# Patient Record
Sex: Female | Born: 1965 | Race: Black or African American | Hispanic: No | Marital: Single | Smoking: Never smoker
Health system: Southern US, Community
[De-identification: ages and names within clinical notes are randomized; demographics above are authoritative.]

## PROBLEM LIST (undated history)

## (undated) DIAGNOSIS — G43909 Migraine, unspecified, not intractable, without status migrainosus: Secondary | ICD-10-CM

## (undated) HISTORY — PX: NO PAST SURGERIES: SHX2092

---

## 2010-12-03 ENCOUNTER — Inpatient Hospital Stay (INDEPENDENT_AMBULATORY_CARE_PROVIDER_SITE_OTHER)
Admission: RE | Admit: 2010-12-03 | Discharge: 2010-12-03 | Disposition: A | Discharge status: Home / Self Care | Payer: PRIVATE HEALTH INSURANCE | Source: Ambulatory Visit | Attending: Family Medicine | Admitting: Family Medicine

## 2010-12-03 DIAGNOSIS — M549 Dorsalgia, unspecified: Secondary | ICD-10-CM

## 2010-12-03 DIAGNOSIS — M461 Sacroiliitis, not elsewhere classified: Secondary | ICD-10-CM

## 2011-01-29 ENCOUNTER — Inpatient Hospital Stay (INDEPENDENT_AMBULATORY_CARE_PROVIDER_SITE_OTHER)
Admission: RE | Admit: 2011-01-29 | Discharge: 2011-01-29 | Disposition: A | Discharge status: Home / Self Care | Payer: PRIVATE HEALTH INSURANCE | Source: Ambulatory Visit | Attending: Emergency Medicine | Admitting: Emergency Medicine

## 2011-01-29 DIAGNOSIS — L259 Unspecified contact dermatitis, unspecified cause: Secondary | ICD-10-CM

## 2011-12-17 ENCOUNTER — Other Ambulatory Visit: Payer: Self-pay | Admitting: Family Medicine

## 2011-12-17 ENCOUNTER — Ambulatory Visit
Admission: RE | Admit: 2011-12-17 | Discharge: 2011-12-17 | Disposition: A | Discharge status: Home / Self Care | Payer: BC Managed Care – PPO | Source: Ambulatory Visit | Attending: Family Medicine | Admitting: Family Medicine

## 2011-12-17 DIAGNOSIS — M439 Deforming dorsopathy, unspecified: Secondary | ICD-10-CM

## 2011-12-17 DIAGNOSIS — R52 Pain, unspecified: Secondary | ICD-10-CM

## 2012-01-02 ENCOUNTER — Other Ambulatory Visit: Payer: Self-pay | Admitting: Family Medicine

## 2012-01-02 DIAGNOSIS — M79605 Pain in left leg: Secondary | ICD-10-CM

## 2012-01-03 ENCOUNTER — Ambulatory Visit
Admission: RE | Admit: 2012-01-03 | Discharge: 2012-01-03 | Disposition: A | Discharge status: Home / Self Care | Payer: BC Managed Care – PPO | Source: Ambulatory Visit | Attending: Family Medicine | Admitting: Family Medicine

## 2012-01-03 DIAGNOSIS — M545 Low back pain: Secondary | ICD-10-CM

## 2012-02-28 ENCOUNTER — Emergency Department (HOSPITAL_COMMUNITY): Payer: BC Managed Care – PPO

## 2012-02-28 ENCOUNTER — Encounter (HOSPITAL_COMMUNITY): Payer: Self-pay | Admitting: Emergency Medicine

## 2012-02-28 ENCOUNTER — Emergency Department (HOSPITAL_COMMUNITY)
Admission: EM | Admit: 2012-02-28 | Discharge: 2012-02-28 | Disposition: A | Discharge status: Home / Self Care | Payer: BC Managed Care – PPO | Attending: Emergency Medicine | Admitting: Emergency Medicine

## 2012-02-28 DIAGNOSIS — D259 Leiomyoma of uterus, unspecified: Secondary | ICD-10-CM | POA: Diagnosis present

## 2012-02-28 DIAGNOSIS — R109 Unspecified abdominal pain: Secondary | ICD-10-CM | POA: Insufficient documentation

## 2012-02-28 DIAGNOSIS — Z79899 Other long term (current) drug therapy: Secondary | ICD-10-CM

## 2012-02-28 DIAGNOSIS — N719 Inflammatory disease of uterus, unspecified: Principal | ICD-10-CM | POA: Diagnosis present

## 2012-02-28 DIAGNOSIS — G8929 Other chronic pain: Secondary | ICD-10-CM | POA: Insufficient documentation

## 2012-02-28 DIAGNOSIS — M549 Dorsalgia, unspecified: Secondary | ICD-10-CM | POA: Diagnosis present

## 2012-02-28 DIAGNOSIS — R000 Tachycardia, unspecified: Secondary | ICD-10-CM | POA: Diagnosis present

## 2012-02-28 LAB — URINALYSIS, ROUTINE W REFLEX MICROSCOPIC
Bilirubin Urine: NEGATIVE
Ketones, ur: NEGATIVE mg/dL
Nitrite: NEGATIVE
Protein, ur: NEGATIVE mg/dL
pH: 6 (ref 5.0–8.0)

## 2012-02-28 LAB — CBC WITH DIFFERENTIAL/PLATELET
Basophils Absolute: 0 10*3/uL (ref 0.0–0.1)
Basophils Relative: 0 % (ref 0–1)
Eosinophils Absolute: 0 10*3/uL (ref 0.0–0.7)
Eosinophils Relative: 0 % (ref 0–5)
HCT: 37 % (ref 36.0–46.0)
Hemoglobin: 11.8 g/dL — ABNORMAL LOW (ref 12.0–15.0)
Lymphocytes Relative: 27 % (ref 12–46)
Lymphocytes Relative: 21 % (ref 12–46)
Lymphs Abs: 3.1 10*3/uL (ref 0.7–4.0)
MCH: 26.8 pg (ref 26.0–34.0)
MCHC: 33.8 g/dL (ref 30.0–36.0)
MCV: 81.7 fL (ref 78.0–100.0)
Monocytes Absolute: 1.3 10*3/uL — ABNORMAL HIGH (ref 0.1–1.0)
Monocytes Relative: 10 % (ref 3–12)
Neutro Abs: 7.4 10*3/uL (ref 1.7–7.7)
Neutrophils Relative %: 63 % (ref 43–77)
Platelets: 301 10*3/uL (ref 150–400)
Platelets: 307 10*3/uL (ref 150–400)
RBC: 4.41 MIL/uL (ref 3.87–5.11)
RDW: 13.9 % (ref 11.5–15.5)
WBC: 11.7 10*3/uL — ABNORMAL HIGH (ref 4.0–10.5)
WBC: 14.9 10*3/uL — ABNORMAL HIGH (ref 4.0–10.5)

## 2012-02-28 LAB — COMPREHENSIVE METABOLIC PANEL
ALT: 9 U/L (ref 0–35)
Alkaline Phosphatase: 74 U/L (ref 39–117)
BUN: 8 mg/dL (ref 6–23)
CO2: 27 mEq/L (ref 19–32)
Chloride: 100 mEq/L (ref 96–112)
GFR calc Af Amer: >90 mL/min (ref >90)
Glucose, Bld: 103 mg/dL — ABNORMAL HIGH (ref 70–99)
Potassium: 3.5 mEq/L (ref 3.5–5.1)
Sodium: 136 mEq/L (ref 135–145)
Total Bilirubin: 0.4 mg/dL (ref 0.3–1.2)

## 2012-02-28 LAB — URINE MICROSCOPIC-ADD ON

## 2012-02-28 LAB — WET PREP, GENITAL
Clue Cells Wet Prep HPF POC: NONE SEEN
Trich, Wet Prep: NONE SEEN

## 2012-02-28 MED ORDER — HYDROCODONE-ACETAMINOPHEN 5-325 MG PO TABS
2.0000 | ORAL_TABLET | Freq: Once | ORAL | Status: AC
  Administered 2012-02-28: 2 via ORAL
  Filled 2012-02-28: qty 2

## 2012-02-28 NOTE — ED Provider Notes (Signed)
History     CSN: [id number]  Arrival date & time 02/28/12  4098   First MD Initiated Contact with Patient 02/28/12 [phone]      Chief Complaint  Patient presents with  . Abdominal Pain  . Back Pain    (Consider location/radiation/quality/duration/timing/severity/associated sxs/prior treatment) HPI Comments: Patient presents with lower abdominal pain that started suddenly last night when she was laying down. She reports an intermittent, sharp and severe pain. She reports taking Lortab, which she has been prescribed for chronic back pain, which helped the pain. The pain does not radiate. She reports a history of chronic back pain, uterine fibroids. She denies abnormal vaginal discharge, abnormal bleeding, hematuria and dysuria. Her LMP was on 02/09/2012.   Patient is a 46 y.o. female presenting with abdominal pain and back pain.  Abdominal Pain The primary symptoms of the illness include abdominal pain. The primary symptoms of the illness do not include fever, fatigue, shortness of breath, nausea, vomiting, diarrhea, dysuria or vaginal discharge.  Additional symptoms associated with the illness include back pain. Symptoms associated with the illness do not include chills, diaphoresis or hematuria.  Back Pain  Associated symptoms include abdominal pain. Pertinent negatives include no chest pain, no fever, no numbness, no headaches and no dysuria.    Past Medical History  Diagnosis Date  . Chronic back pain     History reviewed. No pertinent past surgical history.  History reviewed. No pertinent family history.  History  Substance Use Topics  . Smoking status: Never Smoker   . Smokeless tobacco: Not on file  . Alcohol Use: No    OB History    Grav Para Term Preterm Abortions TAB SAB Ect Mult Living                  Review of Systems  Constitutional: Negative for fever, chills, diaphoresis and fatigue.  Eyes: Negative for visual disturbance.  Respiratory: Negative for  shortness of breath.   Cardiovascular: Negative for chest pain.  Gastrointestinal: Positive for abdominal pain. Negative for nausea, vomiting and diarrhea.  Genitourinary: Negative for dysuria, hematuria, vaginal discharge, difficulty urinating and menstrual problem.  Musculoskeletal: Positive for back pain.  Skin: Negative for rash and wound.  Neurological: Negative for dizziness, numbness and headaches.    Allergies  Penicillins  Home Medications   Current Outpatient Rx  Name Route Sig Dispense Refill  . CYCLOBENZAPRINE HCL ER 15 MG PO CP24 Oral Take 15 mg by mouth daily.    Marland Kitchen HYDROCODONE-ACETAMINOPHEN 7.5-500 MG PO TABS Oral Take 1 tablet by mouth every 8 (eight) hours as needed. For pain    . IBUPROFEN-FAMOTIDINE 800-26.6 MG PO TABS Oral Take 1 tablet by mouth 3 (three) times daily.    Marland Kitchen METHOCARBAMOL 500 MG PO TABS Oral Take 500 mg by mouth 3 (three) times daily.    . ADULT MULTIVITAMIN W/MINERALS CH Oral Take 1 tablet by mouth daily.      BP 144/63  Pulse 55  Temp 98.4 F (36.9 C) (Oral)  Resp 18  SpO2 97%  Physical Exam  Nursing note and vitals reviewed. Constitutional: She is oriented to person, place, and time. She appears well-developed and well-nourished. No distress.  HENT:  Head: Normocephalic and atraumatic.  Mouth/Throat: No oropharyngeal exudate.  Eyes: Conjunctivae are normal. No scleral icterus.  Neck: Normal range of motion.  Cardiovascular: Normal rate, regular rhythm and intact distal pulses.  Exam reveals no gallop and no friction rub.   No murmur heard. Pulmonary/Chest:  Effort normal and breath sounds normal. No respiratory distress. She has no wheezes. She has no rales. She exhibits no tenderness.  Abdominal: Soft.       Diffuse tenderness to palpation most notable in the lower abdomen, especially on the left side.   Genitourinary:       Speculum reveals profuse white, watery discharge in vagina. Bimanual exam reveals a large, tender uterus with  adnexal tenderness to palpation bilaterally. No masses palpated.   Musculoskeletal: Normal range of motion.  Neurological: She is alert and oriented to person, place, and time.  Skin: Skin is warm and dry. She is not diaphoretic.  Psychiatric: She has a normal mood and affect. Her behavior is normal.    ED Course  Procedures (including critical care time)  Labs Reviewed  CBC WITH DIFFERENTIAL - Abnormal; Notable for the following:    WBC 11.7 (*)     Hemoglobin 11.8 (*)     Monocytes Absolute 1.2 (*)     All other components within normal limits  COMPREHENSIVE METABOLIC PANEL - Abnormal; Notable for the following:    Glucose, Bld 103 (*)     Total Protein 8.4 (*)     All other components within normal limits  URINALYSIS, ROUTINE W REFLEX MICROSCOPIC - Abnormal; Notable for the following:    Leukocytes, UA TRACE (*)     All other components within normal limits  URINE MICROSCOPIC-ADD ON - Abnormal; Notable for the following:    Bacteria, UA FEW (*)     All other components within normal limits  WET PREP, GENITAL - Abnormal; Notable for the following:    WBC, Wet Prep HPF POC TOO NUMEROUS TO COUNT (*)     All other components within normal limits  POCT PREGNANCY, URINE  GC/CHLAMYDIA PROBE AMP, GENITAL   US Transvaginal Non-ob  02/28/2012  *RADIOLOGY REPORT*  Clinical Data:  Low abdominal pain.  Question ovarian torsion.  TRANSABDOMINAL ULTRASOUND OF PELVIS DOPPLER ULTRASOUND OF OVARIES  Technique:  Transabdominal ultrasound examination of the pelvis was performed including evaluation of the uterus, ovaries, adnexal regions, and pelvic cul-de-sac.  Color and duplex Doppler ultrasound was utilized to evaluate blood flow to the ovaries.  Comparison:  Lumbar MRI 01/03/2012.  Findings:  Uterus:  The uterus is heterogeneously enlarged with multiple fibroids.  Overall dimensions of the uterus are approximately 15.5 x 11.5 x 11.0 cm.  Largest fibroid is noted in the fundus, measuring  approximately 8.0 x 7.3 x 9.2 cm.  Endometrium:  Distorted by the fibroids and suboptimally visualized.  No gross endometrial thickening identified. Endometrial thickness is approximately 8 mm.  Right ovary:  Not well visualized but measuring approximately 3.2 x 1.8 x 1.7 cm.  No adnexal mass.  Blood flow is seen with color Doppler.  Left ovary:  Not visualized.  Pulsed Doppler evaluation demonstrates normal low-resistance arterial and venous waveforms in the right ovary.  The left ovary is not visualized.  IMPRESSION:  1.  Multiple uterine fibroids as partially imaged on the prior lumbar MRI. 2.  The right ovary appears normal and demonstrates normal blood flow.  The left ovary is not visualized.  No adnexal mass identified.   Original Report Authenticated By: Gerrianne Scale, M.D.    US Pelvis Complete  02/28/2012  *RADIOLOGY REPORT*  Clinical Data:  Low abdominal pain.  Question ovarian torsion.  TRANSABDOMINAL ULTRASOUND OF PELVIS DOPPLER ULTRASOUND OF OVARIES  Technique:  Transabdominal ultrasound examination of the pelvis was performed including evaluation of  the uterus, ovaries, adnexal regions, and pelvic cul-de-sac.  Color and duplex Doppler ultrasound was utilized to evaluate blood flow to the ovaries.  Comparison:  Lumbar MRI 01/03/2012.  Findings:  Uterus:  The uterus is heterogeneously enlarged with multiple fibroids.  Overall dimensions of the uterus are approximately 15.5 x 11.5 x 11.0 cm.  Largest fibroid is noted in the fundus, measuring approximately 8.0 x 7.3 x 9.2 cm.  Endometrium:  Distorted by the fibroids and suboptimally visualized.  No gross endometrial thickening identified. Endometrial thickness is approximately 8 mm.  Right ovary:  Not well visualized but measuring approximately 3.2 x 1.8 x 1.7 cm.  No adnexal mass.  Blood flow is seen with color Doppler.  Left ovary:  Not visualized.  Pulsed Doppler evaluation demonstrates normal low-resistance arterial and venous waveforms in the  right ovary.  The left ovary is not visualized.  IMPRESSION:  1.  Multiple uterine fibroids as partially imaged on the prior lumbar MRI. 2.  The right ovary appears normal and demonstrates normal blood flow.  The left ovary is not visualized.  No adnexal mass identified.   Original Report Authenticated By: Gerrianne Scale, M.D.    [place] Art/ven Flow Abd Pelv Doppler  02/28/2012  *RADIOLOGY REPORT*  Clinical Data:  Low abdominal pain.  Question ovarian torsion.  TRANSABDOMINAL ULTRASOUND OF PELVIS DOPPLER ULTRASOUND OF OVARIES  Technique:  Transabdominal ultrasound examination of the pelvis was performed including evaluation of the uterus, ovaries, adnexal regions, and pelvic cul-de-sac.  Color and duplex Doppler ultrasound was utilized to evaluate blood flow to the ovaries.  Comparison:  Lumbar MRI 01/03/2012.  Findings:  Uterus:  The uterus is heterogeneously enlarged with multiple fibroids.  Overall dimensions of the uterus are approximately 15.5 x 11.5 x 11.0 cm.  Largest fibroid is noted in the fundus, measuring approximately 8.0 x 7.3 x 9.2 cm.  Endometrium:  Distorted by the fibroids and suboptimally visualized.  No gross endometrial thickening identified. Endometrial thickness is approximately 8 mm.  Right ovary:  Not well visualized but measuring approximately 3.2 x 1.8 x 1.7 cm.  No adnexal mass.  Blood flow is seen with color Doppler.  Left ovary:  Not visualized.  Pulsed Doppler evaluation demonstrates normal low-resistance arterial and venous waveforms in the right ovary.  The left ovary is not visualized.  IMPRESSION:  1.  Multiple uterine fibroids as partially imaged on the prior lumbar MRI. 2.  The right ovary appears normal and demonstrates normal blood flow.  The left ovary is not visualized.  No adnexal mass identified.   Original Report Authenticated By: Gerrianne Scale, M.D.      No diagnosis found.    MDM  1:00 PM Patient reports lower abdominal pain. I am controlling her pain with  Vicodin currently. Her pelvic exam reveals lower abdominal tenderness on bimanual exam and profuse watery discharge on speculum exam. She will be transferred to CDU with orders for a pelvic ultrasound with doppler flow. If no ovarian torsion or new mass, she can be discharged with follow up with OBGYN. She should also be treated according to her wet prep and GC/Chlamydia results. Patient signed out to Levy Sjogren ACNP.       Emilia Beck, PA-C 02/28/12 1312

## 2012-02-28 NOTE — ED Provider Notes (Signed)
Medical screening examination/treatment/procedure(s) were conducted as a shared visit with non-physician practitioner(s) and myself.  I personally evaluated the patient during the encounter  Doug Sou, MD 02/28/12 1718

## 2012-02-28 NOTE — ED Provider Notes (Addendum)
Complains of left lower quadrant pain gradual onset last night. Treat with ibuprofen 800 mg without relief. No nausea no vomiting no fever last bowel movement 2 days ago, normal no anorexia no other complaint the pain is mild at present as I examine her on exam abdomen normoactive bowel sounds nondistended minimally tender at left lower quadrant  Doug Sou, MD 02/28/12 1053  1 p.m. patient resting comfortably. His radial home. Plan home observation clear liquids for 12 hours Return if condition worsens Diagnosis nonspecific abdominal pain. Results for orders placed during the hospital encounter of 02/28/12  CBC WITH DIFFERENTIAL      Component Value Range   WBC 11.7 (*) 4.0 - 10.5 K/uL   RBC 4.41  3.87 - 5.11 MIL/uL   Hemoglobin 11.8 (*) 12.0 - 15.0 g/dL   HCT 29.5  62.1 - 30.8 %   MCV 81.6  78.0 - 100.0 fL   MCH 26.8  26.0 - 34.0 pg   MCHC 32.8  30.0 - 36.0 g/dL   RDW 65.7  84.6 - 96.2 %   Platelets 301  150 - 400 K/uL   Neutrophils Relative 63  43 - 77 %   Neutro Abs 7.4  1.7 - 7.7 K/uL   Lymphocytes Relative 27  12 - 46 %   Lymphs Abs 3.1  0.7 - 4.0 K/uL   Monocytes Relative 10  3 - 12 %   Monocytes Absolute 1.2 (*) 0.1 - 1.0 K/uL   Eosinophils Relative 0  0 - 5 %   Eosinophils Absolute 0.0  0.0 - 0.7 K/uL   Basophils Relative 0  0 - 1 %   Basophils Absolute 0.0  0.0 - 0.1 K/uL  COMPREHENSIVE METABOLIC PANEL      Component Value Range   Sodium 136  135 - 145 mEq/L   Potassium 3.5  3.5 - 5.1 mEq/L   Chloride 100  96 - 112 mEq/L   CO2 27  19 - 32 mEq/L   Glucose, Bld 103 (*) 70 - 99 mg/dL   BUN 8  6 - 23 mg/dL   Creatinine, Ser 9.52  0.50 - 1.10 mg/dL   Calcium 9.7  8.4 - 84.1 mg/dL   Total Protein 8.4 (*) 6.0 - 8.3 g/dL   Albumin 3.8  3.5 - 5.2 g/dL   AST 11  0 - 37 U/L   ALT 9  0 - 35 U/L   Alkaline Phosphatase 74  39 - 117 U/L   Total Bilirubin 0.4  0.3 - 1.2 mg/dL   GFR calc non Af Amer >90  >90 mL/min   GFR calc Af Amer >90  >90 mL/min  URINALYSIS, ROUTINE  W REFLEX MICROSCOPIC      Component Value Range   Color, Urine YELLOW  YELLOW   APPearance CLEAR  CLEAR   Specific Gravity, Urine 1.006  1.005 - 1.030   pH 6.0  5.0 - 8.0   Glucose, UA NEGATIVE  NEGATIVE mg/dL   Hgb urine dipstick NEGATIVE  NEGATIVE   Bilirubin Urine NEGATIVE  NEGATIVE   Ketones, ur NEGATIVE  NEGATIVE mg/dL   Protein, ur NEGATIVE  NEGATIVE mg/dL   Urobilinogen, UA 0.2  0.0 - 1.0 mg/dL   Nitrite NEGATIVE  NEGATIVE   Leukocytes, UA TRACE (*) NEGATIVE  POCT PREGNANCY, URINE      Component Value Range   Preg Test, Ur NEGATIVE  NEGATIVE  URINE MICROSCOPIC-ADD ON      Component Value Range   Squamous Epithelial /  LPF RARE  RARE   WBC, UA 0-2  <3 WBC/hpf   Bacteria, UA FEW (*) RARE  WET PREP, GENITAL      Component Value Range   Yeast Wet Prep HPF POC NONE SEEN  NONE SEEN   Trich, Wet Prep NONE SEEN  NONE SEEN   Clue Cells Wet Prep HPF POC NONE SEEN  NONE SEEN   WBC, Wet Prep HPF POC TOO NUMEROUS TO COUNT (*) NONE SEEN   US Transvaginal Non-ob  02/28/2012  *RADIOLOGY REPORT*  Clinical Data:  Low abdominal pain.  Question ovarian torsion.  TRANSABDOMINAL ULTRASOUND OF PELVIS DOPPLER ULTRASOUND OF OVARIES  Technique:  Transabdominal ultrasound examination of the pelvis was performed including evaluation of the uterus, ovaries, adnexal regions, and pelvic cul-de-sac.  Color and duplex Doppler ultrasound was utilized to evaluate blood flow to the ovaries.  Comparison:  Lumbar MRI 01/03/2012.  Findings:  Uterus:  The uterus is heterogeneously enlarged with multiple fibroids.  Overall dimensions of the uterus are approximately 15.5 x 11.5 x 11.0 cm.  Largest fibroid is noted in the fundus, measuring approximately 8.0 x 7.3 x 9.2 cm.  Endometrium:  Distorted by the fibroids and suboptimally visualized.  No gross endometrial thickening identified. Endometrial thickness is approximately 8 mm.  Right ovary:  Not well visualized but measuring approximately 3.2 x 1.8 x 1.7 cm.  No adnexal  mass.  Blood flow is seen with color Doppler.  Left ovary:  Not visualized.  Pulsed Doppler evaluation demonstrates normal low-resistance arterial and venous waveforms in the right ovary.  The left ovary is not visualized.  IMPRESSION:  1.  Multiple uterine fibroids as partially imaged on the prior lumbar MRI. 2.  The right ovary appears normal and demonstrates normal blood flow.  The left ovary is not visualized.  No adnexal mass identified.   Original Report Authenticated By: Gerrianne Scale, M.D.    US Pelvis Complete  02/28/2012  *RADIOLOGY REPORT*  Clinical Data:  Low abdominal pain.  Question ovarian torsion.  TRANSABDOMINAL ULTRASOUND OF PELVIS DOPPLER ULTRASOUND OF OVARIES  Technique:  Transabdominal ultrasound examination of the pelvis was performed including evaluation of the uterus, ovaries, adnexal regions, and pelvic cul-de-sac.  Color and duplex Doppler ultrasound was utilized to evaluate blood flow to the ovaries.  Comparison:  Lumbar MRI 01/03/2012.  Findings:  Uterus:  The uterus is heterogeneously enlarged with multiple fibroids.  Overall dimensions of the uterus are approximately 15.5 x 11.5 x 11.0 cm.  Largest fibroid is noted in the fundus, measuring approximately 8.0 x 7.3 x 9.2 cm.  Endometrium:  Distorted by the fibroids and suboptimally visualized.  No gross endometrial thickening identified. Endometrial thickness is approximately 8 mm.  Right ovary:  Not well visualized but measuring approximately 3.2 x 1.8 x 1.7 cm.  No adnexal mass.  Blood flow is seen with color Doppler.  Left ovary:  Not visualized.  Pulsed Doppler evaluation demonstrates normal low-resistance arterial and venous waveforms in the right ovary.  The left ovary is not visualized.  IMPRESSION:  1.  Multiple uterine fibroids as partially imaged on the prior lumbar MRI. 2.  The right ovary appears normal and demonstrates normal blood flow.  The left ovary is not visualized.  No adnexal mass identified.   Original Report  Authenticated By: Gerrianne Scale, M.D.    [place] Art/ven Flow Abd Pelv Doppler  02/28/2012  *RADIOLOGY REPORT*  Clinical Data:  Low abdominal pain.  Question ovarian torsion.  TRANSABDOMINAL ULTRASOUND OF  PELVIS DOPPLER ULTRASOUND OF OVARIES  Technique:  Transabdominal ultrasound examination of the pelvis was performed including evaluation of the uterus, ovaries, adnexal regions, and pelvic cul-de-sac.  Color and duplex Doppler ultrasound was utilized to evaluate blood flow to the ovaries.  Comparison:  Lumbar MRI 01/03/2012.  Findings:  Uterus:  The uterus is heterogeneously enlarged with multiple fibroids.  Overall dimensions of the uterus are approximately 15.5 x 11.5 x 11.0 cm.  Largest fibroid is noted in the fundus, measuring approximately 8.0 x 7.3 x 9.2 cm.  Endometrium:  Distorted by the fibroids and suboptimally visualized.  No gross endometrial thickening identified. Endometrial thickness is approximately 8 mm.  Right ovary:  Not well visualized but measuring approximately 3.2 x 1.8 x 1.7 cm.  No adnexal mass.  Blood flow is seen with color Doppler.  Left ovary:  Not visualized.  Pulsed Doppler evaluation demonstrates normal low-resistance arterial and venous waveforms in the right ovary.  The left ovary is not visualized.  IMPRESSION:  1.  Multiple uterine fibroids as partially imaged on the prior lumbar MRI. 2.  The right ovary appears normal and demonstrates normal blood flow.  The left ovary is not visualized.  No adnexal mass identified.   Original Report Authenticated By: Gerrianne Scale, M.D.      Doug Sou, MD 02/28/12 [phone]

## 2012-02-28 NOTE — ED Notes (Signed)
Pt c/o lower abd pain into back on left side x 2 days

## 2012-02-28 NOTE — ED Notes (Signed)
Pt reports having lower abd pain since yesterday, reports urinary frequency, denies dysuria, reports vaginal pain; denies CP; came to hospital earlier for same --had pelvic exam done, labs, u/s, and urine sent; was d/c'ed home; was told she had fibroid tumor

## 2012-02-29 ENCOUNTER — Emergency Department (HOSPITAL_COMMUNITY): Payer: BC Managed Care – PPO

## 2012-02-29 ENCOUNTER — Encounter (HOSPITAL_COMMUNITY): Payer: Self-pay | Admitting: *Deleted

## 2012-02-29 ENCOUNTER — Inpatient Hospital Stay (HOSPITAL_COMMUNITY)
Admission: EM | Admit: 2012-02-29 | Discharge: 2012-03-03 | DRG: 368 | Disposition: A | Discharge status: Home / Self Care | Payer: BC Managed Care – PPO | Attending: Obstetrics & Gynecology | Admitting: Obstetrics & Gynecology

## 2012-02-29 DIAGNOSIS — N739 Female pelvic inflammatory disease, unspecified: Secondary | ICD-10-CM

## 2012-02-29 DIAGNOSIS — D219 Benign neoplasm of connective and other soft tissue, unspecified: Secondary | ICD-10-CM

## 2012-02-29 DIAGNOSIS — R109 Unspecified abdominal pain: Secondary | ICD-10-CM

## 2012-02-29 HISTORY — DX: Migraine, unspecified, not intractable, without status migrainosus: G43.909

## 2012-02-29 LAB — COMPREHENSIVE METABOLIC PANEL
ALT: 10 U/L (ref 0–35)
AST: 13 U/L (ref 0–37)
Albumin: 3.9 g/dL (ref 3.5–5.2)
Calcium: 10.1 mg/dL (ref 8.4–10.5)
Creatinine, Ser: 0.63 mg/dL (ref 0.50–1.10)
GFR calc non Af Amer: >90 mL/min (ref >90)
Sodium: 134 mEq/L — ABNORMAL LOW (ref 135–145)
Total Protein: 9.1 g/dL — ABNORMAL HIGH (ref 6.0–8.3)

## 2012-02-29 LAB — LIPASE, BLOOD: Lipase: 19 U/L (ref 11–59)

## 2012-02-29 LAB — URINALYSIS, ROUTINE W REFLEX MICROSCOPIC
Bilirubin Urine: NEGATIVE
Glucose, UA: NEGATIVE mg/dL
Hgb urine dipstick: NEGATIVE
Specific Gravity, Urine: 1.006 (ref 1.005–1.030)
Urobilinogen, UA: 0.2 mg/dL (ref 0.0–1.0)
pH: 7.5 (ref 5.0–8.0)

## 2012-02-29 LAB — POCT PREGNANCY, URINE: Preg Test, Ur: NEGATIVE

## 2012-02-29 LAB — GC/CHLAMYDIA PROBE AMP, GENITAL: Chlamydia, DNA Probe: NEGATIVE

## 2012-02-29 MED ORDER — MORPHINE SULFATE 4 MG/ML IJ SOLN
4.0000 mg | Freq: Once | INTRAMUSCULAR | Status: AC
  Administered 2012-02-29: 4 mg via INTRAVENOUS
  Filled 2012-02-29: qty 1

## 2012-02-29 MED ORDER — IOHEXOL 300 MG/ML  SOLN
100.0000 mL | Freq: Once | INTRAMUSCULAR | Status: AC | PRN
  Administered 2012-02-29: 100 mL via INTRAVENOUS

## 2012-02-29 MED ORDER — CEFTRIAXONE SODIUM 250 MG IJ SOLR: 250.0000 mg | Freq: Once | INTRAMUSCULAR | Status: DC

## 2012-02-29 MED ORDER — IOHEXOL 300 MG/ML  SOLN
20.0000 mL | INTRAMUSCULAR | Status: AC
  Administered 2012-02-29: 20 mL via ORAL

## 2012-02-29 MED ORDER — ONDANSETRON HCL 4 MG/2ML IJ SOLN
4.0000 mg | Freq: Once | INTRAMUSCULAR | Status: AC
  Administered 2012-02-29: 4 mg via INTRAVENOUS
  Filled 2012-02-29: qty 2

## 2012-02-29 MED ORDER — HYDROCODONE-ACETAMINOPHEN 5-325 MG PO TABS
1.0000 | ORAL_TABLET | ORAL | Status: DC | PRN
  Administered 2012-02-29 – 2012-03-02 (×8): 2 via ORAL
  Filled 2012-02-29 (×8): qty 2

## 2012-02-29 MED ORDER — METRONIDAZOLE 500 MG PO TABS
1500.0000 mg | ORAL_TABLET | Freq: Once | ORAL | Status: AC
  Administered 2012-02-29: 1500 mg via ORAL
  Filled 2012-02-29 (×2): qty 3

## 2012-02-29 MED ORDER — SODIUM CHLORIDE 0.9 % IV SOLN: INTRAVENOUS | Status: DC

## 2012-02-29 MED ORDER — DEXTROSE 5 % IV SOLN
2.0000 g | Freq: Four times a day (QID) | INTRAVENOUS | Status: DC
  Administered 2012-02-29 – 2012-03-03 (×12): 2 g via INTRAVENOUS
  Filled 2012-02-29 (×15): qty 2

## 2012-02-29 MED ORDER — PRENATAL MULTIVITAMIN CH
1.0000 | ORAL_TABLET | Freq: Every day | ORAL | Status: DC
  Administered 2012-02-29 – 2012-03-03 (×4): 1 via ORAL
  Filled 2012-02-29 (×4): qty 1

## 2012-02-29 MED ORDER — DEXTROSE 5 % IV SOLN
1.0000 g | Freq: Once | INTRAVENOUS | Status: AC
  Administered 2012-02-29: 1 g via INTRAVENOUS
  Filled 2012-02-29: qty 10

## 2012-02-29 MED ORDER — KCL IN DEXTROSE-NACL 10-5-0.45 MEQ/L-%-% IV SOLN
INTRAVENOUS | Status: DC
  Administered 2012-02-29 – 2012-03-01 (×3): via INTRAVENOUS
  Filled 2012-02-29 (×4): qty 1000

## 2012-02-29 MED ORDER — IBUPROFEN 600 MG PO TABS
600.0000 mg | ORAL_TABLET | Freq: Four times a day (QID) | ORAL | Status: DC | PRN
  Administered 2012-03-01 – 2012-03-02 (×4): 600 mg via ORAL
  Filled 2012-02-29 (×3): qty 1

## 2012-02-29 MED ORDER — HYDROMORPHONE HCL PF 1 MG/ML IJ SOLN
1.0000 mg | INTRAMUSCULAR | Status: AC | PRN
  Administered 2012-02-29 (×2): 1 mg via INTRAVENOUS
  Filled 2012-02-29 (×2): qty 1

## 2012-02-29 MED ORDER — SODIUM CHLORIDE 0.9 % IV BOLUS (SEPSIS)
1000.0000 mL | Freq: Once | INTRAVENOUS | Status: AC
  Administered 2012-02-29: 1000 mL via INTRAVENOUS

## 2012-02-29 MED ORDER — DOXYCYCLINE HYCLATE 100 MG PO TABS
100.0000 mg | ORAL_TABLET | Freq: Two times a day (BID) | ORAL | Status: DC
  Administered 2012-02-29 – 2012-03-03 (×6): 100 mg via ORAL
  Filled 2012-02-29 (×7): qty 1

## 2012-02-29 MED ORDER — DEXTROSE 5 % IV SOLN
500.0000 mg | Freq: Once | INTRAVENOUS | Status: AC
  Administered 2012-02-29: 500 mg via INTRAVENOUS
  Filled 2012-02-29: qty 500

## 2012-02-29 MED ORDER — METRONIDAZOLE IVPB CUSTOM: 1.0000 g | Freq: Once | INTRAVENOUS | Status: DC

## 2012-02-29 MED ORDER — DOXYCYCLINE HYCLATE 100 MG IV SOLR
100.0000 mg | Freq: Two times a day (BID) | INTRAVENOUS | Status: DC
  Filled 2012-02-29 (×2): qty 100

## 2012-02-29 MED ORDER — METRONIDAZOLE IN NACL 5-0.79 MG/ML-% IV SOLN
500.0000 mg | Freq: Once | INTRAVENOUS | Status: AC
  Administered 2012-02-29: 500 mg via INTRAVENOUS
  Filled 2012-02-29: qty 100

## 2012-02-29 MED ORDER — ONDANSETRON HCL 4 MG/2ML IJ SOLN
4.0000 mg | Freq: Three times a day (TID) | INTRAMUSCULAR | Status: AC | PRN
  Administered 2012-02-29: 4 mg via INTRAVENOUS
  Filled 2012-02-29: qty 2

## 2012-02-29 MED ORDER — ACETAMINOPHEN 325 MG PO TABS
975.0000 mg | ORAL_TABLET | Freq: Once | ORAL | Status: AC
  Administered 2012-02-29: 975 mg via ORAL
  Filled 2012-02-29: qty 3

## 2012-02-29 NOTE — ED Notes (Signed)
CT called at notified them that the patient has completed the contrast

## 2012-02-29 NOTE — ED Notes (Signed)
Assisted up to the BR.  Amb very slowly

## 2012-02-29 NOTE — ED Notes (Signed)
Pt. Very drowsy after receiving pain medication.    Will monitor. Pain has decreased 6/10.  Vitals WNL, pt is in NAD

## 2012-02-29 NOTE — ED Provider Notes (Addendum)
History     CSN: [id number]  Arrival date & time 02/28/12  2253   First MD Initiated Contact with Patient 02/29/12 0147      Chief Complaint  Patient presents with  . Abdominal Pain    (Consider location/radiation/quality/duration/timing/severity/associated sxs/prior treatment) HPI Pt seen yesterday for lower abd pain starting 2 days ago. U/s showed fibroids. Pt states the pain has worsened since leaving ED. She LLQ>RLQ pain. She had profuse vag D/C on pelvic exam. + too numerous to count WBC on wet prep with uterine and adnexal TTP.  Past Medical History  Diagnosis Date  . Chronic back pain   . Migraine     History reviewed. No pertinent past surgical history.  No family history on file.  History  Substance Use Topics  . Smoking status: Never Smoker   . Smokeless tobacco: Not on file  . Alcohol Use: No    OB History    Grav Para Term Preterm Abortions TAB SAB Ect Mult Living                  Review of Systems  Constitutional: Negative for fever and chills.  Gastrointestinal: Positive for abdominal pain. Negative for nausea, vomiting and diarrhea.  Genitourinary: Positive for vaginal discharge and pelvic pain. Negative for flank pain, vaginal bleeding and vaginal pain.  Skin: Negative for rash and wound.  Neurological: Negative for dizziness, weakness, light-headedness, numbness and headaches.    Allergies  Oatmeal; Shrimp; Coconut oil; and Penicillins  Home Medications   Current Outpatient Rx  Name Route Sig Dispense Refill  . CYCLOBENZAPRINE HCL ER 15 MG PO CP24 Oral Take 15 mg by mouth daily.    . IBUPROFEN-FAMOTIDINE 800-26.6 MG PO TABS Oral Take 1 tablet by mouth 3 (three) times daily.    Marland Kitchen METHOCARBAMOL 500 MG PO TABS Oral Take 500 mg by mouth 3 (three) times daily.    . ADULT MULTIVITAMIN W/MINERALS CH Oral Take 1 tablet by mouth daily.      BP 105/70  Pulse 113  Temp 98.4 F (36.9 C) (Oral)  Resp 20  SpO2 96%  LMP 02/09/2012  Physical Exam    Nursing note and vitals reviewed. Constitutional: She is oriented to person, place, and time. She appears well-developed and well-nourished. She appears distressed.  HENT:  Head: Normocephalic and atraumatic.  Mouth/Throat: Oropharynx is clear and moist.  Eyes: EOM are normal. Pupils are equal, round, and reactive to light.  Neck: Normal range of motion. Neck supple.  Cardiovascular: Regular rhythm.        tachycardia  Pulmonary/Chest: Effort normal and breath sounds normal. No respiratory distress. She has no wheezes. She has no rales.  Abdominal: Soft. Bowel sounds are normal. She exhibits no distension and no mass. There is tenderness (TTP LLQ >RLQ. no rebound or guarding). There is no rebound and no guarding.  Genitourinary:       Refer to recent pelvic exam by PA.   Musculoskeletal: Normal range of motion. She exhibits no edema and no tenderness.  Neurological: She is alert and oriented to person, place, and time.  Skin: Skin is warm and dry. No rash noted. No erythema.  Psychiatric: She has a normal mood and affect. Her behavior is normal.    ED Course  Procedures (including critical care time)  Labs Reviewed  CBC WITH DIFFERENTIAL - Abnormal; Notable for the following:    WBC 14.9 (*)     Neutro Abs 10.5 (*)     Monocytes  Absolute 1.3 (*)     All other components within normal limits  COMPREHENSIVE METABOLIC PANEL - Abnormal; Notable for the following:    Sodium 134 (*)     Glucose, Bld 117 (*)     Total Protein 9.1 (*)     All other components within normal limits  URINALYSIS, ROUTINE W REFLEX MICROSCOPIC - Abnormal; Notable for the following:    APPearance CLOUDY (*)     Ketones, ur 15 (*)     All other components within normal limits  POCT PREGNANCY, URINE  LIPASE, BLOOD   US Transvaginal Non-ob  02/28/2012  *RADIOLOGY REPORT*  Clinical Data:  Low abdominal pain.  Question ovarian torsion.  TRANSABDOMINAL ULTRASOUND OF PELVIS DOPPLER ULTRASOUND OF OVARIES   Technique:  Transabdominal ultrasound examination of the pelvis was performed including evaluation of the uterus, ovaries, adnexal regions, and pelvic cul-de-sac.  Color and duplex Doppler ultrasound was utilized to evaluate blood flow to the ovaries.  Comparison:  Lumbar MRI 01/03/2012.  Findings:  Uterus:  The uterus is heterogeneously enlarged with multiple fibroids.  Overall dimensions of the uterus are approximately 15.5 x 11.5 x 11.0 cm.  Largest fibroid is noted in the fundus, measuring approximately 8.0 x 7.3 x 9.2 cm.  Endometrium:  Distorted by the fibroids and suboptimally visualized.  No gross endometrial thickening identified. Endometrial thickness is approximately 8 mm.  Right ovary:  Not well visualized but measuring approximately 3.2 x 1.8 x 1.7 cm.  No adnexal mass.  Blood flow is seen with color Doppler.  Left ovary:  Not visualized.  Pulsed Doppler evaluation demonstrates normal low-resistance arterial and venous waveforms in the right ovary.  The left ovary is not visualized.  IMPRESSION:  1.  Multiple uterine fibroids as partially imaged on the prior lumbar MRI. 2.  The right ovary appears normal and demonstrates normal blood flow.  The left ovary is not visualized.  No adnexal mass identified.   Original Report Authenticated By: Gerrianne Scale, M.D.    US Pelvis Complete  02/28/2012  *RADIOLOGY REPORT*  Clinical Data:  Low abdominal pain.  Question ovarian torsion.  TRANSABDOMINAL ULTRASOUND OF PELVIS DOPPLER ULTRASOUND OF OVARIES  Technique:  Transabdominal ultrasound examination of the pelvis was performed including evaluation of the uterus, ovaries, adnexal regions, and pelvic cul-de-sac.  Color and duplex Doppler ultrasound was utilized to evaluate blood flow to the ovaries.  Comparison:  Lumbar MRI 01/03/2012.  Findings:  Uterus:  The uterus is heterogeneously enlarged with multiple fibroids.  Overall dimensions of the uterus are approximately 15.5 x 11.5 x 11.0 cm.  Largest fibroid  is noted in the fundus, measuring approximately 8.0 x 7.3 x 9.2 cm.  Endometrium:  Distorted by the fibroids and suboptimally visualized.  No gross endometrial thickening identified. Endometrial thickness is approximately 8 mm.  Right ovary:  Not well visualized but measuring approximately 3.2 x 1.8 x 1.7 cm.  No adnexal mass.  Blood flow is seen with color Doppler.  Left ovary:  Not visualized.  Pulsed Doppler evaluation demonstrates normal low-resistance arterial and venous waveforms in the right ovary.  The left ovary is not visualized.  IMPRESSION:  1.  Multiple uterine fibroids as partially imaged on the prior lumbar MRI. 2.  The right ovary appears normal and demonstrates normal blood flow.  The left ovary is not visualized.  No adnexal mass identified.   Original Report Authenticated By: Gerrianne Scale, M.D.    Ct Abdomen Pelvis W Contrast  02/29/2012  *  RADIOLOGY REPORT*  Clinical Data: Abdominal pain  CT ABDOMEN AND PELVIS WITH CONTRAST  Technique:  Multidetector CT imaging of the abdomen and pelvis was performed following the standard protocol during bolus administration of intravenous contrast.  Contrast: OMNIPAQUE IOHEXOL 300 MG/ML  SOLN  Comparison: 02/28/2012 ultrasound  Findings: Mild bibasilar opacities.  Heart size upper normal limits.  Subcentimeter hypodensity within the right hepatic lobe is most in keeping with a biliary cyst or hamartoma.  Distended gallbladder, contains gallstones.  No pericholecystic fluid or gallbladder wall thickening.  No biliary ductal dilatation.  Unremarkable spleen, pancreas, and adrenal glands.  Subcentimeter hypodensity within the lower pole right kidney is nonspecific.  Right pelvicaliectasis. Unable to follow the ureteral course in its entirety as the ureter is decompressed and displaced by the enlarged/fibroid uterus. No hydroureter where visualized.  No bowel obstruction.  No CT evidence for colitis.  Normal appendix.  No free intraperitoneal air or  fluid.  No lymphadenopathy.  Normal caliber vasculature.  Thin-walled bladder.  Fibroid uterus.  Displaced adnexa without definite mass.  Trace free fluid within the pelvis.  No acute osseous finding.  IMPRESSION: Enlarged/fibroid uterus.  Right-sided pelvicaliectasis without hydroureter.  Cholelithiasis without CT evidence for cholecystitis.   Original Report Authenticated By: Waneta Martins, M.D.    [place] Art/ven Flow Abd Pelv Doppler  02/28/2012  *RADIOLOGY REPORT*  Clinical Data:  Low abdominal pain.  Question ovarian torsion.  TRANSABDOMINAL ULTRASOUND OF PELVIS DOPPLER ULTRASOUND OF OVARIES  Technique:  Transabdominal ultrasound examination of the pelvis was performed including evaluation of the uterus, ovaries, adnexal regions, and pelvic cul-de-sac.  Color and duplex Doppler ultrasound was utilized to evaluate blood flow to the ovaries.  Comparison:  Lumbar MRI 01/03/2012.  Findings:  Uterus:  The uterus is heterogeneously enlarged with multiple fibroids.  Overall dimensions of the uterus are approximately 15.5 x 11.5 x 11.0 cm.  Largest fibroid is noted in the fundus, measuring approximately 8.0 x 7.3 x 9.2 cm.  Endometrium:  Distorted by the fibroids and suboptimally visualized.  No gross endometrial thickening identified. Endometrial thickness is approximately 8 mm.  Right ovary:  Not well visualized but measuring approximately 3.2 x 1.8 x 1.7 cm.  No adnexal mass.  Blood flow is seen with color Doppler.  Left ovary:  Not visualized.  Pulsed Doppler evaluation demonstrates normal low-resistance arterial and venous waveforms in the right ovary.  The left ovary is not visualized.  IMPRESSION:  1.  Multiple uterine fibroids as partially imaged on the prior lumbar MRI. 2.  The right ovary appears normal and demonstrates normal blood flow.  The left ovary is not visualized.  No adnexal mass identified.   Original Report Authenticated By: Gerrianne Scale, M.D.      1. Pelvic inflammatory disease (PID)    2. Abdominal pain   3. Fibroids       MDM   CT with enlarged uterus with multiple fibroids. GB is non-tender to palp. Based on pelvic exam done earlier with profuse d/c and TNTC WBC, will treat for PID.    Discussed with Dr Christell Constant. Pt has persistent tachycardia and pain has returned. Will treat with continued IVF's and IV abx. Dr will admit to observation bed and see.         Loren Racer, MD 02/29/12 0800  Loren Racer, MD 02/29/12 [phone]

## 2012-02-29 NOTE — ED Notes (Signed)
Requesting something to eat or drink until all results are back

## 2012-02-29 NOTE — ED Notes (Signed)
Patient rates pain 10/10 at this time.  Med ordered

## 2012-02-29 NOTE — H&P (Signed)
[name] [name] is an 46 y.o. female. G3P2-0-1-2with a 3 d h/o pelvic pain and the patient states that years ago she noticed increasing urgency and frequency and back pain and took a shot to decrease the size of the fibroids and now is having pain in the uterus.  She has abnormal discharge and a tender uterus with minimal CMT and will admit for Observation and monitoring clinical status and IV antibiotics  Pertinent Gynecological History: Menses: flow is moderate last 3 days Bleeding: wnl Contraception: vasectomy DES exposure: denies Blood transfusions: none Sexually transmitted diseases: no past history Previous GYN Procedures: none  Last mammogram: normal Date: 10/2010 Last pap: normal Date: 10/2010 OB History: G3, P2-0-1-2   Menstrual History: Menarche age: na Patient's last menstrual period was 02/09/2012.    Past Medical History  Diagnosis Date  . Chronic back pain   . Migraine     History reviewed. No pertinent past surgical history.  No family history on file. has a herniate lumbar disc dx 4/13  Social History:  reports that she has never smoked. She does not have any smokeless tobacco history on file. She reports that she does not drink alcohol or use illicit drugs. Has fiancee with her and monogamous and never had STD Allergies:  Allergies  Allergen Reactions  . Oatmeal Anaphylaxis  . Shrimp (Shellfish Allergy) Anaphylaxis  . Coconut Oil Hives  . Penicillins Hives    Prescriptions prior to admission  Medication Sig Dispense Refill  . cyclobenzaprine (AMRIX) 15 MG 24 hr capsule Take 15 mg by mouth daily.      . Ibuprofen-Famotidine 800-26.6 MG TABS Take 1 tablet by mouth 3 (three) times daily.      . methocarbamol (ROBAXIN) 500 MG tablet Take 500 mg by mouth 3 (three) times daily.      . Multiple Vitamin (MULTIVITAMIN WITH MINERALS) TABS Take 1 tablet by mouth daily.        Review of Systems  Constitutional: Positive for fever, chills, malaise/fatigue and  diaphoresis. Negative for weight loss.  HENT: Negative for hearing loss, nosebleeds, congestion, sore throat and neck pain.   Eyes: Negative for blurred vision, double vision, discharge and redness.  Respiratory: Negative for cough, shortness of breath and wheezing.   Cardiovascular: Negative for chest pain, palpitations, orthopnea and leg swelling.  Gastrointestinal: Negative for heartburn, nausea, vomiting, diarrhea, constipation and blood in stool.  Genitourinary: Negative for dysuria, urgency and hematuria.  Musculoskeletal: Positive for back pain.  Skin: Negative for itching and rash.  Neurological: Positive for weakness. Negative for dizziness, tingling, tremors, loss of consciousness and headaches.  Endo/Heme/Allergies: Does not bruise/bleed easily.  Psychiatric/Behavioral: Negative for depression and suicidal ideas.    Blood pressure 98/57, pulse 117, temperature 99.4 F (37.4 C), temperature source Oral, resp. rate 20, last menstrual period 02/09/2012, SpO2 100.00%. Physical Exam  Constitutional: She is oriented to person, place, and time. She appears well-developed and well-nourished. She appears distressed.  HENT:  Head: Normocephalic and atraumatic.  Mouth/Throat: Oropharynx is clear and moist. No oropharyngeal exudate.  Eyes: Conjunctivae and EOM are normal. Pupils are equal, round, and reactive to light. Right eye exhibits no discharge. Left eye exhibits no discharge.  Neck: Normal range of motion. Neck supple. No tracheal deviation present. No thyromegaly present.  Cardiovascular: Regular rhythm and normal heart sounds.        tachyarrhythmia  Respiratory: Effort normal and breath sounds normal. No respiratory distress. She has no wheezes. She has no rales.  GI: Soft. Bowel  sounds are normal. She exhibits mass. She exhibits no distension. There is tenderness. There is no rebound and no guarding.       Uterus to umbilicus at U-2 and is increased and source of pain and is  tender and nonacute abdomen  Genitourinary:       Abnormal purulent discharge and friable cervix per MD and bimanual by me reveals no discrete nor acute pelvic mass or abscess but the uterus  Is tender and there is minimal CMT and the uterus is 18 week size  Musculoskeletal: Normal range of motion.  Lymphadenopathy:    She has no cervical adenopathy.  Neurological: She is alert and oriented to person, place, and time. She has normal reflexes.    Results for orders placed during the hospital encounter of 02/29/12 (from the past 24 hour(s))  CBC WITH DIFFERENTIAL     Status: Abnormal   Collection Time   02/28/12 11:04 PM      Component Value Range   WBC 14.9 (*) 4.0 - 10.5 K/uL   RBC 4.53  3.87 - 5.11 MIL/uL   Hemoglobin 12.5  12.0 - 15.0 g/dL   HCT 16.1  09.6 - 04.5 %   MCV 81.7  78.0 - 100.0 fL   MCH 27.6  26.0 - 34.0 pg   MCHC 33.8  30.0 - 36.0 g/dL   RDW 40.9  81.1 - 91.4 %   Platelets 307  150 - 400 K/uL   Neutrophils Relative 71  43 - 77 %   Neutro Abs 10.5 (*) 1.7 - 7.7 K/uL   Lymphocytes Relative 21  12 - 46 %   Lymphs Abs 3.1  0.7 - 4.0 K/uL   Monocytes Relative 9  3 - 12 %   Monocytes Absolute 1.3 (*) 0.1 - 1.0 K/uL   Eosinophils Relative 0  0 - 5 %   Eosinophils Absolute 0.0  0.0 - 0.7 K/uL   Basophils Relative 0  0 - 1 %   Basophils Absolute 0.0  0.0 - 0.1 K/uL  COMPREHENSIVE METABOLIC PANEL     Status: Abnormal   Collection Time   02/28/12 11:04 PM      Component Value Range   Sodium 134 (*) 135 - 145 mEq/L   Potassium 3.7  3.5 - 5.1 mEq/L   Chloride 98  96 - 112 mEq/L   CO2 24  19 - 32 mEq/L   Glucose, Bld 117 (*) 70 - 99 mg/dL   BUN 6  6 - 23 mg/dL   Creatinine, Ser 7.82  0.50 - 1.10 mg/dL   Calcium 95.6  8.4 - 21.3 mg/dL   Total Protein 9.1 (*) 6.0 - 8.3 g/dL   Albumin 3.9  3.5 - 5.2 g/dL   AST 13  0 - 37 U/L   ALT 10  0 - 35 U/L   Alkaline Phosphatase 77  39 - 117 U/L   Total Bilirubin 0.4  0.3 - 1.2 mg/dL   GFR calc non Af Amer >90  >90 mL/min   GFR  calc Af Amer >90  >90 mL/min  URINALYSIS, ROUTINE W REFLEX MICROSCOPIC     Status: Abnormal   Collection Time   02/29/12 12:18 AM      Component Value Range   Color, Urine YELLOW  YELLOW   APPearance CLOUDY (*) CLEAR   Specific Gravity, Urine 1.006  1.005 - 1.030   pH 7.5  5.0 - 8.0   Glucose, UA NEGATIVE  NEGATIVE mg/dL  Hgb urine dipstick NEGATIVE  NEGATIVE   Bilirubin Urine NEGATIVE  NEGATIVE   Ketones, ur 15 (*) NEGATIVE mg/dL   Protein, ur NEGATIVE  NEGATIVE mg/dL   Urobilinogen, UA 0.2  0.0 - 1.0 mg/dL   Nitrite NEGATIVE  NEGATIVE   Leukocytes, UA NEGATIVE  NEGATIVE  POCT PREGNANCY, URINE     Status: Normal   Collection Time   02/29/12 12:24 AM      Component Value Range   Preg Test, Ur NEGATIVE  NEGATIVE  LIPASE, BLOOD     Status: Normal   Collection Time   02/29/12  8:27 AM      Component Value Range   Lipase 19  11 - 59 U/L    US Transvaginal Non-ob  02/28/2012  *RADIOLOGY REPORT*  Clinical Data:  Low abdominal pain.  Question ovarian torsion.  TRANSABDOMINAL ULTRASOUND OF PELVIS DOPPLER ULTRASOUND OF OVARIES  Technique:  Transabdominal ultrasound examination of the pelvis was performed including evaluation of the uterus, ovaries, adnexal regions, and pelvic cul-de-sac.  Color and duplex Doppler ultrasound was utilized to evaluate blood flow to the ovaries.  Comparison:  Lumbar MRI 01/03/2012.  Findings:  Uterus:  The uterus is heterogeneously enlarged with multiple fibroids.  Overall dimensions of the uterus are approximately 15.5 x 11.5 x 11.0 cm.  Largest fibroid is noted in the fundus, measuring approximately 8.0 x 7.3 x 9.2 cm.  Endometrium:  Distorted by the fibroids and suboptimally visualized.  No gross endometrial thickening identified. Endometrial thickness is approximately 8 mm.  Right ovary:  Not well visualized but measuring approximately 3.2 x 1.8 x 1.7 cm.  No adnexal mass.  Blood flow is seen with color Doppler.  Left ovary:  Not visualized.  Pulsed Doppler  evaluation demonstrates normal low-resistance arterial and venous waveforms in the right ovary.  The left ovary is not visualized.  IMPRESSION:  1.  Multiple uterine fibroids as partially imaged on the prior lumbar MRI. 2.  The right ovary appears normal and demonstrates normal blood flow.  The left ovary is not visualized.  No adnexal mass identified.   Original Report Authenticated By: Gerrianne Scale, M.D.    US Pelvis Complete  02/28/2012  *RADIOLOGY REPORT*  Clinical Data:  Low abdominal pain.  Question ovarian torsion.  TRANSABDOMINAL ULTRASOUND OF PELVIS DOPPLER ULTRASOUND OF OVARIES  Technique:  Transabdominal ultrasound examination of the pelvis was performed including evaluation of the uterus, ovaries, adnexal regions, and pelvic cul-de-sac.  Color and duplex Doppler ultrasound was utilized to evaluate blood flow to the ovaries.  Comparison:  Lumbar MRI 01/03/2012.  Findings:  Uterus:  The uterus is heterogeneously enlarged with multiple fibroids.  Overall dimensions of the uterus are approximately 15.5 x 11.5 x 11.0 cm.  Largest fibroid is noted in the fundus, measuring approximately 8.0 x 7.3 x 9.2 cm.  Endometrium:  Distorted by the fibroids and suboptimally visualized.  No gross endometrial thickening identified. Endometrial thickness is approximately 8 mm.  Right ovary:  Not well visualized but measuring approximately 3.2 x 1.8 x 1.7 cm.  No adnexal mass.  Blood flow is seen with color Doppler.  Left ovary:  Not visualized.  Pulsed Doppler evaluation demonstrates normal low-resistance arterial and venous waveforms in the right ovary.  The left ovary is not visualized.  IMPRESSION:  1.  Multiple uterine fibroids as partially imaged on the prior lumbar MRI. 2.  The right ovary appears normal and demonstrates normal blood flow.  The left ovary is not visualized.  No adnexal mass identified.   Original Report Authenticated By: Gerrianne Scale, M.D.    Ct Abdomen Pelvis W Contrast  02/29/2012   *RADIOLOGY REPORT*  Clinical Data: Abdominal pain  CT ABDOMEN AND PELVIS WITH CONTRAST  Technique:  Multidetector CT imaging of the abdomen and pelvis was performed following the standard protocol during bolus administration of intravenous contrast.  Contrast: OMNIPAQUE IOHEXOL 300 MG/ML  SOLN  Comparison: 02/28/2012 ultrasound  Findings: Mild bibasilar opacities.  Heart size upper normal limits.  Subcentimeter hypodensity within the right hepatic lobe is most in keeping with a biliary cyst or hamartoma.  Distended gallbladder, contains gallstones.  No pericholecystic fluid or gallbladder wall thickening.  No biliary ductal dilatation.  Unremarkable spleen, pancreas, and adrenal glands.  Subcentimeter hypodensity within the lower pole right kidney is nonspecific.  Right pelvicaliectasis. Unable to follow the ureteral course in its entirety as the ureter is decompressed and displaced by the enlarged/fibroid uterus. No hydroureter where visualized.  No bowel obstruction.  No CT evidence for colitis.  Normal appendix.  No free intraperitoneal air or fluid.  No lymphadenopathy.  Normal caliber vasculature.  Thin-walled bladder.  Fibroid uterus.  Displaced adnexa without definite mass.  Trace free fluid within the pelvis.  No acute osseous finding.  IMPRESSION: Enlarged/fibroid uterus.  Right-sided pelvicaliectasis without hydroureter.  Cholelithiasis without CT evidence for cholecystitis.   Original Report Authenticated By: Waneta Martins, M.D.    [place] Art/ven Flow Abd Pelv Doppler  02/28/2012  *RADIOLOGY REPORT*  Clinical Data:  Low abdominal pain.  Question ovarian torsion.  TRANSABDOMINAL ULTRASOUND OF PELVIS DOPPLER ULTRASOUND OF OVARIES  Technique:  Transabdominal ultrasound examination of the pelvis was performed including evaluation of the uterus, ovaries, adnexal regions, and pelvic cul-de-sac.  Color and duplex Doppler ultrasound was utilized to evaluate blood flow to the ovaries.  Comparison:   Lumbar MRI 01/03/2012.  Findings:  Uterus:  The uterus is heterogeneously enlarged with multiple fibroids.  Overall dimensions of the uterus are approximately 15.5 x 11.5 x 11.0 cm.  Largest fibroid is noted in the fundus, measuring approximately 8.0 x 7.3 x 9.2 cm.  Endometrium:  Distorted by the fibroids and suboptimally visualized.  No gross endometrial thickening identified. Endometrial thickness is approximately 8 mm.  Right ovary:  Not well visualized but measuring approximately 3.2 x 1.8 x 1.7 cm.  No adnexal mass.  Blood flow is seen with color Doppler.  Left ovary:  Not visualized.  Pulsed Doppler evaluation demonstrates normal low-resistance arterial and venous waveforms in the right ovary.  The left ovary is not visualized.  IMPRESSION:  1.  Multiple uterine fibroids as partially imaged on the prior lumbar MRI. 2.  The right ovary appears normal and demonstrates normal blood flow.  The left ovary is not visualized.  No adnexal mass identified.   Original Report Authenticated By: Gerrianne Scale, M.D.     Assessment/Plan: Metritis  Symptomatic fibroid uterus  Antibiotics and observation and IV hydration/support  Am concerned that the fibroids are degenerating and would present a similar picture but will treat for metritis and PID at this point and provide support otherwise and observe [name] [name] H. 02/29/2012, 5:35 PM

## 2012-03-01 LAB — CBC WITH DIFFERENTIAL/PLATELET
Eosinophils Relative: 0 % (ref 0–5)
HCT: 32.3 % — ABNORMAL LOW (ref 36.0–46.0)
Hemoglobin: 10.5 g/dL — ABNORMAL LOW (ref 12.0–15.0)
Lymphocytes Relative: 21 % (ref 12–46)
Lymphs Abs: 2.4 10*3/uL (ref 0.7–4.0)
MCV: 82.6 fL (ref 78.0–100.0)
Monocytes Absolute: 1.1 10*3/uL — ABNORMAL HIGH (ref 0.1–1.0)
Monocytes Relative: 9 % (ref 3–12)
RBC: 3.91 MIL/uL (ref 3.87–5.11)
RDW: 14.1 % (ref 11.5–15.5)
WBC: 11.8 10*3/uL — ABNORMAL HIGH (ref 4.0–10.5)

## 2012-03-01 LAB — COMPREHENSIVE METABOLIC PANEL
Albumin: 2.9 g/dL — ABNORMAL LOW (ref 3.5–5.2)
Alkaline Phosphatase: 62 U/L (ref 39–117)
BUN: 4 mg/dL — ABNORMAL LOW (ref 6–23)
Calcium: 9.2 mg/dL (ref 8.4–10.5)
Potassium: 3.7 mEq/L (ref 3.5–5.1)
Total Protein: 7.3 g/dL (ref 6.0–8.3)

## 2012-03-01 MED ORDER — SODIUM CHLORIDE 0.9 % IV SOLN: INTRAVENOUS | Status: DC

## 2012-03-01 MED ORDER — ALUM & MAG HYDROXIDE-SIMETH 200-200-20 MG/5ML PO SUSP: 30.0000 mL | Freq: Four times a day (QID) | ORAL | Status: DC | PRN

## 2012-03-01 MED ORDER — ACETAMINOPHEN 325 MG PO TABS: 650.0000 mg | ORAL_TABLET | Freq: Four times a day (QID) | ORAL | Status: DC | PRN

## 2012-03-01 MED ORDER — ACETAMINOPHEN 650 MG RE SUPP: 650.0000 mg | Freq: Four times a day (QID) | RECTAL | Status: DC | PRN

## 2012-03-01 MED ORDER — HEPARIN SODIUM (PORCINE) 5000 UNIT/ML IJ SOLN
5000.0000 [IU] | Freq: Three times a day (TID) | INTRAMUSCULAR | Status: DC
  Filled 2012-03-01 (×3): qty 1

## 2012-03-01 MED ORDER — DOCUSATE SODIUM 100 MG PO CAPS
200.0000 mg | ORAL_CAPSULE | Freq: Two times a day (BID) | ORAL | Status: DC
  Administered 2012-03-01 – 2012-03-03 (×4): 200 mg via ORAL
  Filled 2012-03-01 (×4): qty 2

## 2012-03-01 NOTE — Progress Notes (Addendum)
Subjective: Patient reports tolerating PO and no problems voiding.  + flatus and no BM yet and states the pain has decreased  Objective: I have reviewed patient's vital signs, medications and labs. Patient Vitals for the past 24 hrs:  BP Temp Temp src Pulse Resp SpO2 Height Weight  03/01/12 0619 104/68 mmHg 98.7 F (37.1 C) Oral 104  18  100 % - -  02/29/12 2114 112/67 mmHg 98.2 F (36.8 C) Oral 108  18  100 % 5\' 4"  (1.626 m) 84.8 kg (186 lb 15.2 oz)  02/29/12 1613 98/57 mmHg 99.4 F (37.4 C) - 117  20  100 % - -  02/29/12 1400 108/58 mmHg - - 125  - 100 % - -   General: alert, cooperative and no distress Resp: clear to auscultation bilaterally Cardio: regular rate and rhythm, S1, S2 normal, no murmur, click, rub or gallop GI: nonacute and tender but decreased since yesterday Extremities: extremities normal, atraumatic, no cyanosis or edema   Assessment/Plan: Metritis  Fibroid uterus  Pelvic pain Continue abx as the pt seems to have responded but is still slightly tachycardic and the pain medicine has helped  LOS: 1 day  CBC    Component Value Date/Time   WBC 11.8* 03/01/2012 0610   RBC 3.91 03/01/2012 0610   HGB 10.5* 03/01/2012 0610   HCT 32.3* 03/01/2012 0610   PLT 277 03/01/2012 0610   MCV 82.6 03/01/2012 0610   MCH 26.9 03/01/2012 0610   MCHC 32.5 03/01/2012 0610   RDW 14.1 03/01/2012 0610   LYMPHSABS 2.4 03/01/2012 0610   MONOABS 1.1* 03/01/2012 0610   EOSABS 0.1 03/01/2012 0610   BASOSABS 0.0 03/01/2012 0610   CMP     Component Value Date/Time   NA 131* 03/01/2012 0610   K 3.7 03/01/2012 0610   CL 100 03/01/2012 0610   CO2 23 03/01/2012 0610   GLUCOSE 124* 03/01/2012 0610   BUN 4* 03/01/2012 0610   CREATININE 0.56 03/01/2012 0610   CALCIUM 9.2 03/01/2012 0610   PROT 7.3 03/01/2012 0610   ALBUMIN 2.9* 03/01/2012 0610   AST 11 03/01/2012 0610   ALT 8 03/01/2012 0610   ALKPHOS 62 03/01/2012 0610   BILITOT 0.4 03/01/2012 0610   GFRNONAA >90 03/01/2012 0610   GFRAA >90 03/01/2012  0610     Attallah Ontko H. 03/01/2012, 12:00 PM

## 2012-03-02 NOTE — Progress Notes (Signed)
Patient ID: [name] [name], female   DOB: [birth year], 45 y.o.   MRN: [id number] Subjective: Patient reports tolerating PO and no problems voiding.  + flatus and no BM yet and states the pain has decreased and feels better  Objective: I have reviewed patient's vital signs, medications and labs. Patient Vitals for the past 24 hrs:  BP Temp Temp src Pulse Resp SpO2  03/02/12 0524 111/80 mmHg 97.8 F (36.6 C) - 101  18  99 %  03/01/12 2125 123/76 mmHg 99.6 F (37.6 C) Oral 100  18  100 %  03/01/12 1343 107/70 mmHg 98.1 F (36.7 C) Oral 101  18  100 %   General: alert, cooperative and no distress Resp: clear to auscultation bilaterally Cardio: regular rate and rhythm, S1, S2 normal, no murmur, click, rub or gallop GI: nonacute and tender but decreased since yesterday Extremities: extremities normal, atraumatic, no cyanosis or edema   Assessment/Plan: Metritis improved  Fibroid uterus  Pelvic pain Continue abx as the pt seems to have responded but is still slightly tachycardic and the pain medicine has helped and the pain has improved and if 48 hours afebrile may be able to discharge in the morning  LOS: 2 days  CBC    Component Value Date/Time   WBC 11.8* 03/01/2012 0610   RBC 3.91 03/01/2012 0610   HGB 10.5* 03/01/2012 0610   HCT 32.3* 03/01/2012 0610   PLT 277 03/01/2012 0610   MCV 82.6 03/01/2012 0610   MCH 26.9 03/01/2012 0610   MCHC 32.5 03/01/2012 0610   RDW 14.1 03/01/2012 0610   LYMPHSABS 2.4 03/01/2012 0610   MONOABS 1.1* 03/01/2012 0610   EOSABS 0.1 03/01/2012 0610   BASOSABS 0.0 03/01/2012 0610   CMP     Component Value Date/Time   NA 131* 03/01/2012 0610   K 3.7 03/01/2012 0610   CL 100 03/01/2012 0610   CO2 23 03/01/2012 0610   GLUCOSE 124* 03/01/2012 0610   BUN 4* 03/01/2012 0610   CREATININE 0.56 03/01/2012 0610   CALCIUM 9.2 03/01/2012 0610   PROT 7.3 03/01/2012 0610   ALBUMIN 2.9* 03/01/2012 0610   AST 11 03/01/2012 0610   ALT 8 03/01/2012 0610   ALKPHOS 62 03/01/2012  0610   BILITOT 0.4 03/01/2012 0610   GFRNONAA >90 03/01/2012 0610   GFRAA >90 03/01/2012 0610     Clarke Peretz H. 03/02/2012, 10:48 AM

## 2012-03-03 ENCOUNTER — Encounter (HOSPITAL_COMMUNITY): Payer: Self-pay | Admitting: General Practice

## 2012-03-03 LAB — COMPREHENSIVE METABOLIC PANEL
Albumin: 3 g/dL — ABNORMAL LOW (ref 3.5–5.2)
Alkaline Phosphatase: 78 U/L (ref 39–117)
BUN: 7 mg/dL (ref 6–23)
Creatinine, Ser: 0.53 mg/dL (ref 0.50–1.10)
GFR calc Af Amer: >90 mL/min (ref >90)
Glucose, Bld: 140 mg/dL — ABNORMAL HIGH (ref 70–99)
Total Bilirubin: 0.2 mg/dL — ABNORMAL LOW (ref 0.3–1.2)
Total Protein: 7.8 g/dL (ref 6.0–8.3)

## 2012-03-03 LAB — CBC WITH DIFFERENTIAL/PLATELET
Basophils Relative: 0 % (ref 0–1)
Eosinophils Absolute: 0 10*3/uL (ref 0.0–0.7)
HCT: 34.2 % — ABNORMAL LOW (ref 36.0–46.0)
Hemoglobin: 11.3 g/dL — ABNORMAL LOW (ref 12.0–15.0)
Lymphs Abs: 2 10*3/uL (ref 0.7–4.0)
MCH: 27.1 pg (ref 26.0–34.0)
MCHC: 33 g/dL (ref 30.0–36.0)
MCV: 82 fL (ref 78.0–100.0)
Monocytes Absolute: 1.5 10*3/uL — ABNORMAL HIGH (ref 0.1–1.0)
Monocytes Relative: 12 % (ref 3–12)
RBC: 4.17 MIL/uL (ref 3.87–5.11)

## 2012-03-03 MED ORDER — DOXYCYCLINE HYCLATE 100 MG PO TABS: 100.0000 mg | ORAL_TABLET | Freq: Two times a day (BID) | ORAL | Status: AC

## 2012-03-03 MED ORDER — DOXYCYCLINE HYCLATE 50 MG PO CAPS: 100.0000 mg | ORAL_CAPSULE | Freq: Two times a day (BID) | ORAL | Status: AC

## 2012-03-03 MED ORDER — METRONIDAZOLE 500 MG PO TABS: 500.0000 mg | ORAL_TABLET | Freq: Two times a day (BID) | ORAL | Status: AC

## 2012-03-03 NOTE — Discharge Summary (Signed)
Physician Discharge Summary  Patient ID: [name] [name] MRN: [id number] DOB/AGE: 1966-01-20 46 y.o.  Admit date: 02/29/2012 Discharge date: 03/03/2012  Admission Diagnoses: Pelvic pain Fibroid uterus Metritis PID Discharge Diagnoses:  Active Problems:  * No active hospital problems. *    Discharged Condition: fair  Hospital Course: The patient was admitted for IV antibiotics and supportive therapy for presumed metritis and pelvic pain related to an enlarged fibroid uterus.  There was some concern that she had borderline septicemia.  However, she responded well with resolution of most of her pain.  However she still is having some pain.  This morning she feels much better.  She wished to go home.  She remained afebrile for most of her hospital stay.  Consults: None  Significant Diagnostic Studies: CT scan and ultrasound were consistent with large fibroid uterus  Treatments: IV hydration and antibiotics: Mefoxin, metronidazole and doxycycline  Discharge Exam: Blood pressure 107/62, pulse 108, temperature 98.5 F (36.9 C), temperature source Oral, resp. rate 20, height 5\' 4"  (1.626 m), weight 84.8 kg (186 lb 15.2 oz), last menstrual period 03/02/2012, SpO2 100.00%. General appearance: alert, cooperative and no distress Back: negative, symmetric, no curvature. ROM normal. No CVA tenderness. Resp: clear to auscultation bilaterally Chest wall: no tenderness GI: soft, non-tender; bowel sounds normal; no masses,  no organomegaly and tenderness at the 16 week size mass in the pelvis CV RRR Extremities no evidence of DVT Disposition: 01-Home or Self Care  Discharge Orders    Future Orders Please Complete By Expires   Diet - low sodium heart healthy      Increase activity slowly      Discharge instructions      Comments:   Call if pain worsens or fever develops   No wound care      Call MD for:  extreme fatigue      Call MD for:  persistant dizziness or light-headedness      Call MD for:  hives      Call MD for:  difficulty breathing, headache or visual disturbances      Call MD for:  redness, tenderness, or signs of infection (pain, swelling, redness, odor or green/yellow discharge around incision site)      Call MD for:  severe uncontrolled pain      Call MD for:  persistant nausea and vomiting      Call MD for:  temperature >100.4      Schedule appointment      Scheduling Instructions:   2 week followup in the office [phone]     Medication List  As of 03/03/2012  1:41 PM   STOP taking these medications         HYDROcodone-acetaminophen 7.5-500 MG per tablet         TAKE these medications         cyclobenzaprine 15 MG 24 hr capsule   Commonly known as: AMRIX   Take 15 mg by mouth daily.      doxycycline 100 MG tablet   Commonly known as: VIBRA-TABS   Take 1 tablet (100 mg total) by mouth 2 (two) times daily.      doxycycline 50 MG capsule   Commonly known as: VIBRAMYCIN   Take 2 capsules (100 mg total) by mouth 2 (two) times daily.      Ibuprofen-Famotidine 800-26.6 MG Tabs   Take 1 tablet by mouth 3 (three) times daily.      methocarbamol 500 MG tablet   Commonly known  as: ROBAXIN   Take 500 mg by mouth 3 (three) times daily.      metroNIDAZOLE 500 MG tablet   Commonly known as: FLAGYL   Take 1 tablet (500 mg total) by mouth 2 (two) times daily.      multivitamin with minerals Tabs   Take 1 tablet by mouth daily.            We'll continue antibiotics for 10 more days to complete a 2 week course and follow up office.  More like tissue of the hysterectomy secondary to the enlarged fibroids in approximately 6-8 weeks. SignedDelbert Harness. 03/03/2012, 1:41 PM

## 2012-03-03 NOTE — Progress Notes (Signed)
Patient ID: [name] [name], female   DOB: May 15, 1966, 46 y.o.   MRN: [id number] See discharge summary note

## 2012-03-05 ENCOUNTER — Other Ambulatory Visit: Payer: Self-pay | Admitting: Obstetrics & Gynecology

## 2012-03-05 ENCOUNTER — Other Ambulatory Visit (HOSPITAL_COMMUNITY)
Admission: RE | Admit: 2012-03-05 | Discharge: 2012-03-05 | Disposition: A | Discharge status: Home / Self Care | Payer: BC Managed Care – PPO | Source: Ambulatory Visit | Attending: Obstetrics & Gynecology | Admitting: Obstetrics & Gynecology

## 2012-03-05 DIAGNOSIS — Z1151 Encounter for screening for human papillomavirus (HPV): Secondary | ICD-10-CM | POA: Insufficient documentation

## 2012-03-05 DIAGNOSIS — Z124 Encounter for screening for malignant neoplasm of cervix: Secondary | ICD-10-CM | POA: Insufficient documentation

## 2012-03-11 ENCOUNTER — Other Ambulatory Visit: Payer: Self-pay | Admitting: Obstetrics & Gynecology

## 2012-03-11 DIAGNOSIS — Z1231 Encounter for screening mammogram for malignant neoplasm of breast: Secondary | ICD-10-CM

## 2012-03-26 ENCOUNTER — Ambulatory Visit: Payer: BC Managed Care – PPO

## 2012-03-27 ENCOUNTER — Ambulatory Visit: Payer: BC Managed Care – PPO

## 2012-04-17 ENCOUNTER — Ambulatory Visit: Payer: BC Managed Care – PPO

## 2012-04-17 ENCOUNTER — Ambulatory Visit
Admission: RE | Admit: 2012-04-17 | Discharge: 2012-04-17 | Disposition: A | Discharge status: Home / Self Care | Payer: BC Managed Care – PPO | Source: Ambulatory Visit | Attending: Obstetrics & Gynecology | Admitting: Obstetrics & Gynecology

## 2012-04-17 DIAGNOSIS — Z1231 Encounter for screening mammogram for malignant neoplasm of breast: Secondary | ICD-10-CM

## 2012-04-28 ENCOUNTER — Encounter (HOSPITAL_COMMUNITY): Payer: Self-pay | Admitting: Pharmacist

## 2012-05-07 ENCOUNTER — Other Ambulatory Visit: Payer: Self-pay | Admitting: Obstetrics & Gynecology

## 2012-05-07 ENCOUNTER — Encounter (HOSPITAL_COMMUNITY): Payer: Self-pay

## 2012-05-07 ENCOUNTER — Encounter (HOSPITAL_COMMUNITY)
Admission: RE | Admit: 2012-05-07 | Discharge: 2012-05-07 | Disposition: A | Discharge status: Home / Self Care | Payer: BC Managed Care – PPO | Source: Ambulatory Visit | Attending: Obstetrics & Gynecology | Admitting: Obstetrics & Gynecology

## 2012-05-07 LAB — CBC
Hemoglobin: 10.9 g/dL — ABNORMAL LOW (ref 12.0–15.0)
MCH: 25.5 pg — ABNORMAL LOW (ref 26.0–34.0)
MCHC: 31.1 g/dL (ref 30.0–36.0)
Platelets: 349 10*3/uL (ref 150–400)
RDW: 13.9 % (ref 11.5–15.5)

## 2012-05-07 LAB — SURGICAL PCR SCREEN: MRSA, PCR: NEGATIVE

## 2012-05-07 NOTE — Patient Instructions (Addendum)
Your procedure is scheduled on:05/11/12  Enter through the Main Entrance at :0900 Pick up desk phone and dial [phone] and inform us of your arrival.  Please call [phone] if you have any problems the morning of surgery.  Remember: Do not eat or drink after midnight:Sunday   Take these meds the morning of surgery with a sip of water:none  DO NOT wear jewelry, eye make-up, lipstick,body lotion, or dark fingernail polish. Do not shave for 48 hours prior to surgery.  If you are to be admitted after surgery, leave suitcase in car until your room has been assigned. Patients discharged on the day of surgery will not be allowed to drive home.

## 2012-05-10 ENCOUNTER — Other Ambulatory Visit: Payer: Self-pay | Admitting: Obstetrics & Gynecology

## 2012-05-10 NOTE — H&P (Signed)
--------------------------------------------------------------------------------  [name], [name]  45 Y  old  Female, DOB: 01/23/1966  [address], [place], [place]-[address]  Home: [phone]      Guarantor: [name], [name]    Insurance: [employer]  Referring: [name] [name]  Appointment Facility: [name] [name]   -------------------------------------------------------------------------------- 04/28/2012 [name] [name] H. [name] Landing, MD    Reason for Appointment  1. Pre-operative      History of Present Illness  here for:          46 year old female presents with c/o here for preoperative visit for possible TAH and possible BSO secondary to enlarged fibroid uterus. I met the patient through the ER at [facility] because of metritis which responded to antibiotics. An US at the time revealed                     Uterus: The uterus is heterogeneously enlarged with multiple fibroids. Overall dimensions of the uterus are approximately 15.5 x 11.5 x 11.0 cm. Largest fibroid is noted in the fundus, measuring approximately 8.0 x 7.3 x 9.2 cm. Endometrium: Distorted by the fibroids and suboptimally visualized. No gross endometrial thickening identified. Endometrial thickness is approximately 8 mm. Right ovary: Not well visualized but measuring approximately 3.2 x 1.8 x 1.7 cm. No adnexal mass. Blood flow is seen with color Doppler. Left ovary: Not visualized. Pulsed Doppler evaluation demonstrates normal low-resistance arterial and venous waveforms in the right ovary. The left ovary is not visualized. IMPRESSION: 1. Multiple uterine fibroids as partially imaged on the prior lumbar MRI. 2. The right ovary appears normal and demonstrates normal blood flow. The left ovary is not visualized. 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  The adnexa were within normal limits.   She completed a prolonged course of antibiotics and it has been over 2 months since the inflammation of the PID 03-03-12  .  GYN:          c/o Abdominal pain: intermittent, mild.         c/o Dysmenorrhea: moderate.         c/o Dyspareunia: deep, moderate.         c/o Hot flashes: mild.         c/o Pelvic pain: intermittent, continuous pressure that radiates to back and she has noticed increased frequency.         Denies : Incontinence:.         Itching:.     Current Medications  Ibuprofen 800 MG Tablet 1 tablet Three times a day  Ambien 10 MG Tablet 1 tablet at bedtime as needed Once a day  Cyclobenzaprine HCl 10 MG Tablet 1 tablet Three times a day        Past Medical History  Migraines  Low back pain      Surgical History  Denies Past Surgical History      Family History  Father: alive hypertension   Mother: alive diabetes mellitus   Maternal Grand Mother: diabetes mellitus   Siblings: sister with mesothelioma       Social History  Tobacco Use:         Tobacco Use/Smoking              Are you a  nonsmoker Sexual History:         Sexual History            Had sex in the past 12 months (vaginal, oral, or anal)?  Yes          with  Men only Drugs/Alcohol:         Drugs            Have you used drugs other than those for medical reasons in the past 12 months?  No       Alcohol Screen            Did you have a drink containing alcohol in the past year?  No          Points  0          Interpretation  Negative Miscellaneous:         no Domestic violence.        Marital status: married.        Natural support system: yes.     Gyn History  H/O Periods :  every 28 days and less than 8 days mostly 2-3 days , heavy blood loss.   Last pap smear date  03-06-12 cytology negative and HPV HR negative.   Last mammogram date  3/12 and was WNL and was scheduled to  have another one but could not get done before the procedure and Clinical exam is WNL and SBE per pt has no changes.   Denies H/O Sexually Transmitted Diseases (STDs)  none.   H/O Dysmenorrhea  yes.   Hysterectomy  desires hysterectomy.      OB History  Total pregnancies  G3 P2-0-1-2 with 2 prior vaginal deliveries.      Allergies  Penicillin : hives  shrimp: anaphylaxis  oatmeal: anaphylaxis  coconut: hives      Hospitalization/Major Diagnostic Procedure  PID 8/13      Review of Systems  General/Constitutional:          Patient denies change in appetite, chills, fatigue, fever, night sweats, lightheadedness, , weight gain, weight loss.  Patient complaining of headache.  Change in appetite denies.  Chills denies.  Fatigue denies.  Fever denies.  Headache admits.  Lightheadedness denies.  Night sweats denies.  Sleep disturbance denies.      Ophthalmologic:          Denies Blurred vision.  Denies Diminished visual acuity.  Denies Discharge.  Denies Dry eye.      ENT:          Denies Decreased hearing.  Denies Difficulty swallowing.  Denies Dry mouth.  Denies Ear pain.  Denies Ringing in the ears.      Endocrine:          Denies Cold intolerance.  Denies Difficulty sleeping.  Denies Dizziness.  Admits Excessive sweating.  Denies Heat intolerance.      Respiratory:          Denies Breathing pattern.  Denies Chest pain.  Denies Cough.  Denies Hemoptysis.  Denies Shortness of breath at rest.  Denies Sputum production.  Denies Wheezing.      Breast:          Denies Breast lump.  Denies Breast swelling.  Denies Fever.  Denies Nipple discharge.      Cardiovascular:          Denies Chest pain at rest.  Denies Chest pain with exertion.  Denies Claudication.  Denies Cyanosis.    Denies Difficulty laying flat.  Denies Dizziness.  Denies Irregular heartbeat.  Denies Palpitations.  Denies Shortness of breath.      Gastrointestinal:          Denies Blood in stool.  Admits Constipation, admits.   Diarrhea denies.  Denies Hematemesis.      Hematology:          Denies Breast lump.  Denies Groin mass.  Denies Recent transfusion.  Denies Weakness.      Women Only:          Denies Breast lump.  Denies Breast pain.  Admits Hot flashes.  Denies Irregular menses.  Denies Vaginal bleeding between periods.      Genitourinary:          Denies Blood in urine.  Denies Difficulty urinating.  Denies Pain in lower back.  Denies Painful urination.      Musculoskeletal:          Denies Leg cramps.  Denies Muscle aches.      Skin:          Denies Blistering of skin.  Denies Discoloration.  Denies Hives.  Denies Itching.  Denies Rash.  Denies Skin lesion(s).      Neurologic:          Denies Balance difficulty.  Denies Difficulty speaking.  Denies Dizziness.  Denies Fainting.  Denies Gait abnormality.  Denies Tingling/Numbness.  Denies Transient loss of vision.      Psychiatric:          Denies Anxiety.  Denies Delusions.  Denies Depressed mood.  Denies Eating disorder.  Denies Suicidal thoughts.      admits to low back pain.    Vital Signs  HR 84 /min, BP 116/68 mm Hg, Wt 182 lbs, RR 19 /min, Wt-kg 82.55 kg.    Examination  General Examination:        GENERAL APPEARANCE: in no acute distress, well developed, well nourished.          HEAD: normocephalic, atraumatic.          EYES: pupils equal, round, reactive to light.          EARS: normal.          NOSE: nares patent.          ORAL CAVITY: mucosa moist, good dentition, tongue in midline.          THROAT: normal, no exudate, no erythema.          NECK/THYROID: normal, neck supple, neck supple, full range of motion, no cervical lymphadenopathy, no thyromegaly.          LYMPH NODES: no axillary, supraclavicular or inguinal adenopathy.          SKIN: normal, no rashes, no suspicious lesions.          HEART: normal, no murmurs.          LUNGS: normal, good air movement, no wheezes, rales, rhonchi.          CHEST: clear to auscultation and  percussion.          BREASTS: not examined.          ABDOMEN: normal, liver nontender, bowel sounds present, no guarding or rigidity, very large mass seen in the midline at about 18 weeks.          RECTAL: no problems on visual inspection.          FEMALE GENITOURINARY: normal, chaperone in room, External genitalia, bulbourethral and Skene   glands are within normal limits, labia without lesions or masses, Speculum exam:, vaginal mucosa pink, no lesions, vaginal discharge clear, vaginal discharge white, cervix without lesions, nontender, bimanual exam, unable to fully evaluate adnexa and the mass is 18 weeks in size.          EXTREMITIES: no edema, normal, full range of motion.          NEUROLOGIC: nonfocal, alert and oriented, gait normal.       Assessments  1. Leiomyoma of uterus, unspecified - 218.9 (Primary)  2. Dysmenorrhea - 625.3    h/o PID.    Treatment  1. Leiomyoma of uterus, unspecified   proceed with diagnostic laparoscopy as the anatomy may permit a laparoscopic approach but will not know until we place the camera and will have a significant chance of a open or abdominal hysterectomy we discussed the use of lupron and of continued NSAID usage and versus myomectomy versus hysterectomy and +/- ovarian removal.        Surgery counseling:  The patient is aware that the risks specific to the procedure are that there is a potential for bleeding and the possibility of a transfusion. She realizes that this procedure will make her sterile. There is a potential for damage to other intra-abdominal organs to include ovaries, tubes, bladder, ureters, and bowel, sometimes recognized at the time of surgery and repaired at that time with a potential for a larger incision and/or a second surgeon. The patient is also aware of the fact that these may not be discovered until later date and that she would need to let us know about any postoperative complications by calling me, the office or proceeding to  the emergency room. If these complications are not found at the time of surgery and are discovered later date they may need surgical repair at that time. The patient is also aware the potential for infection and the potential for prolonged hospital stay and the potential for antibiotics on an inpatient and/or outpatient basis. Patient is aware of the potential for treating her pain and being free of pain, but the pain may not be cured with this surgery and the pain may may be worsened or new after the surgery. This could be because of scar tissue or the result of healing post procedure or the result that the pain is related to another potential etiology. She understands the pelvic pain is complex with the potential for gynecological, musculoskeletal, neurological, urological and gastrointestinal etiologies, however, we think that the pain is related to the pathology that is gynecological and is the results of her endometriosis and most recent US that revealed an  Enlarged fibroid uterus. She understands the above and wishes to proceed with the procedure.   The patient is also aware that we will attempt of procedure with minimally invasive technique. This is a laparoscopic hysterectomy. However, the patient also understands that if for some reason there is a judgment that would occur during the procedure to convert this to a open procedure or a laparotomy this would have to be done.  She is well aware that this is probably as high as 50% because of the PID history and the size of the uterus.    the ERT discussion: The patient may change her mind and not have her ovaries removed but in case this is necessary for any reason. Both ovaries are to be removed thus making the patient menopausal. She understands this will be a naturally occurring phenomena and with the   average age of 48 years old for menopause. She also understands that we will have to deal with it potential symptoms of menopause. She is aware that  that potential could be estrogen replacement therapy she has now been made aware of the increased risk of clotting and stroke with the use of estrogen therapy. She is aware of the fact that this may take several months after her surgery to get her hormonal balance where she is satisfied.      Transfusion counseling:  The patient is aware of the possibility with this procedure of the use of blood replacement products in the form of a transfusion. The patient is aware of the risks benefits and alternatives of these. She is aware that we would only use these on an emergency basis or sometimes if considered to significantly reduce the risk of transfusion requirement during surgery. The patient is aware of the risk of the each unit of transfusion having a risk of HIV being approximately 1 in 1 million and having a risk of hepatitis being 1 in 2000, with sometimes the development of chronic active hepatitis bleeding to the potential for liver failure and liver transplant, and having a low substantial risk of transfusion reaction were her body would reject the replacement when we thought she needed it. The patient understands them wishes to proceed.  The patient was counseled regarding the risks, benefits, and alternatives of estrogen replacement therapy. The risks include, but not completely limited to the increased risk of deep vein thrombosis, pulmonary embolus, and stroke. These risks are thought to be mitigated by a baby aspirin per day, which is recommended to this patient and also the use of topical estrogen. The benefits clearly are for the prevention and/or treatment of osteoporosis, osteopenia and bone loss. Other benefits include decreased climacteric/menopausal symptoms of hot flashes, night sweats, mood, lability, insomnia, and vaginal dryness. Alternatively, there is no life threatening reason to start estrogen therapy. Certainly there are medications to treat osteopenia and osteoporosis and there are  bone building exercises of weightbearing and supplemental calcium and vitamin D to help decrease bone loss. There are also holistic and over-the-counter medicines that can mitigate the symptoms of menopause. Certainly, vaginal dryness can be treated with topical estrogen and decrease the risks of systemic estrogen.  Breast tenderness can result as well as abnormal or postmenopausal bleeding. Certainly the use of progesterone can prevent potential for endometrial cancer. Since the patient will not have a uterus both estrogen and progesterone would not have to be used.  Certainly the goal is to find the lowest effective dose to satisfy symptoms and maintain bone density. This can be accomplished over the course of several months or years with the goal being to individualize the therapy.       Procedure Codes  Preop Pre-op Visit No charge              Electronically signed by Evann Erazo , MD on 05/10/2012 at 06:17 PM EST  Sign off status: Pending     --------------------------------------------------------------------------------  [name] [address] [place], [place] [id number] Tel: [phone] Fax: [phone]         --------------------------------------------------------------------------------   Patient: [name], [name]    DOB: 03/10/1966     Progress Note: Bela Bonaparte H. Deltha Bernales, MD    04/28/2012    Note generated by eClinicalWorks EMR/PM Software ([email or web address])      

## 2012-05-11 ENCOUNTER — Encounter (HOSPITAL_COMMUNITY): Payer: Self-pay | Admitting: Anesthesiology

## 2012-05-11 ENCOUNTER — Inpatient Hospital Stay (HOSPITAL_COMMUNITY)
Admission: RE | Admit: 2012-05-11 | Discharge: 2012-05-13 | DRG: 359 | Disposition: A | Discharge status: Home / Self Care | Payer: BC Managed Care – PPO | Source: Ambulatory Visit | Attending: Obstetrics & Gynecology | Admitting: Obstetrics & Gynecology

## 2012-05-11 ENCOUNTER — Inpatient Hospital Stay (HOSPITAL_COMMUNITY): Payer: BC Managed Care – PPO | Admitting: Anesthesiology

## 2012-05-11 ENCOUNTER — Encounter (HOSPITAL_COMMUNITY): Payer: Self-pay | Admitting: *Deleted

## 2012-05-11 ENCOUNTER — Encounter (HOSPITAL_COMMUNITY)
Admission: RE | Disposition: A | Discharge status: Home / Self Care | Payer: Self-pay | Source: Ambulatory Visit | Attending: Obstetrics & Gynecology

## 2012-05-11 DIAGNOSIS — Z01812 Encounter for preprocedural laboratory examination: Secondary | ICD-10-CM

## 2012-05-11 DIAGNOSIS — N83209 Unspecified ovarian cyst, unspecified side: Secondary | ICD-10-CM | POA: Diagnosis present

## 2012-05-11 DIAGNOSIS — D251 Intramural leiomyoma of uterus: Secondary | ICD-10-CM | POA: Diagnosis present

## 2012-05-11 DIAGNOSIS — Z01818 Encounter for other preprocedural examination: Secondary | ICD-10-CM

## 2012-05-11 DIAGNOSIS — N736 Female pelvic peritoneal adhesions (postinfective): Secondary | ICD-10-CM | POA: Diagnosis present

## 2012-05-11 DIAGNOSIS — D250 Submucous leiomyoma of uterus: Principal | ICD-10-CM | POA: Diagnosis present

## 2012-05-11 DIAGNOSIS — N946 Dysmenorrhea, unspecified: Secondary | ICD-10-CM | POA: Diagnosis present

## 2012-05-11 DIAGNOSIS — Z90710 Acquired absence of both cervix and uterus: Secondary | ICD-10-CM

## 2012-05-11 DIAGNOSIS — IMO0002 Reserved for concepts with insufficient information to code with codable children: Secondary | ICD-10-CM | POA: Diagnosis present

## 2012-05-11 HISTORY — PX: CYSTOSCOPY: SHX5120

## 2012-05-11 HISTORY — PX: ABDOMINAL HYSTERECTOMY: SHX81

## 2012-05-11 LAB — BASIC METABOLIC PANEL
Calcium: 9.5 mg/dL (ref 8.4–10.5)
GFR calc Af Amer: >90 mL/min (ref >90)
GFR calc non Af Amer: >90 mL/min (ref >90)
Glucose, Bld: 105 mg/dL — ABNORMAL HIGH (ref 70–99)
Sodium: 136 mEq/L (ref 135–145)

## 2012-05-11 LAB — TYPE AND SCREEN
ABO/RH(D): B POS
Antibody Screen: NEGATIVE

## 2012-05-11 LAB — CBC
MCH: 26.5 pg (ref 26.0–34.0)
Platelets: 335 10*3/uL (ref 150–400)
RBC: 4.38 MIL/uL (ref 3.87–5.11)

## 2012-05-11 LAB — PREGNANCY, URINE: Preg Test, Ur: NEGATIVE

## 2012-05-11 SURGERY — HYSTERECTOMY, ABDOMINAL
Anesthesia: Epidural | Site: Bladder | Wound class: Clean Contaminated

## 2012-05-11 MED ORDER — KETOROLAC TROMETHAMINE 30 MG/ML IJ SOLN: 15.0000 mg | Freq: Once | INTRAMUSCULAR | Status: DC | PRN

## 2012-05-11 MED ORDER — MEPERIDINE HCL 25 MG/ML IJ SOLN: 6.2500 mg | INTRAMUSCULAR | Status: DC | PRN

## 2012-05-11 MED ORDER — GLYCOPYRROLATE 0.2 MG/ML IJ SOLN
INTRAMUSCULAR | Status: DC | PRN
  Administered 2012-05-11: 0.4 mg via INTRAVENOUS

## 2012-05-11 MED ORDER — CIPROFLOXACIN IN D5W 400 MG/200ML IV SOLN
400.0000 mg | INTRAVENOUS | Status: AC
  Administered 2012-05-11: 400 mg via INTRAVENOUS
  Filled 2012-05-11: qty 200

## 2012-05-11 MED ORDER — PANTOPRAZOLE SODIUM 40 MG IV SOLR
40.0000 mg | Freq: Every day | INTRAVENOUS | Status: DC
  Administered 2012-05-11 – 2012-05-12 (×2): 40 mg via INTRAVENOUS
  Filled 2012-05-11 (×2): qty 40

## 2012-05-11 MED ORDER — DEXAMETHASONE SODIUM PHOSPHATE 10 MG/ML IJ SOLN
INTRAMUSCULAR | Status: AC
  Filled 2012-05-11: qty 1

## 2012-05-11 MED ORDER — MIDAZOLAM HCL 2 MG/2ML IJ SOLN
INTRAMUSCULAR | Status: AC
  Filled 2012-05-11: qty 2

## 2012-05-11 MED ORDER — ONDANSETRON HCL 4 MG/2ML IJ SOLN
INTRAMUSCULAR | Status: DC | PRN
  Administered 2012-05-11: 4 mg via INTRAVENOUS

## 2012-05-11 MED ORDER — METRONIDAZOLE IN NACL 5-0.79 MG/ML-% IV SOLN
500.0000 mg | INTRAVENOUS | Status: AC
  Administered 2012-05-11: 500 mg via INTRAVENOUS
  Filled 2012-05-11: qty 100

## 2012-05-11 MED ORDER — HYDROMORPHONE HCL PF 1 MG/ML IJ SOLN
INTRAMUSCULAR | Status: AC
  Filled 2012-05-11: qty 1

## 2012-05-11 MED ORDER — POTASSIUM CHLORIDE 2 MEQ/ML IV SOLN
INTRAVENOUS | Status: DC
  Administered 2012-05-11 – 2012-05-13 (×5): via INTRAVENOUS
  Filled 2012-05-11 (×8): qty 1000

## 2012-05-11 MED ORDER — PROPOFOL 10 MG/ML IV BOLUS
INTRAVENOUS | Status: DC | PRN
  Administered 2012-05-11: 150 mg via INTRAVENOUS

## 2012-05-11 MED ORDER — LACTATED RINGERS IV SOLN: INTRAVENOUS | Status: DC

## 2012-05-11 MED ORDER — 0.9 % SODIUM CHLORIDE (POUR BTL) OPTIME
TOPICAL | Status: DC | PRN
  Administered 2012-05-11: 1000 mL

## 2012-05-11 MED ORDER — INDIGOTINDISULFONATE SODIUM 8 MG/ML IJ SOLN
INTRAMUSCULAR | Status: DC | PRN
  Administered 2012-05-11: 5 mL via INTRAVENOUS

## 2012-05-11 MED ORDER — NEOSTIGMINE METHYLSULFATE 1 MG/ML IJ SOLN
INTRAMUSCULAR | Status: AC
  Filled 2012-05-11: qty 10

## 2012-05-11 MED ORDER — ONDANSETRON HCL 4 MG PO TABS: 4.0000 mg | ORAL_TABLET | Freq: Four times a day (QID) | ORAL | Status: DC | PRN

## 2012-05-11 MED ORDER — MORPHINE SULFATE 4 MG/ML IJ SOLN
2.0000 mg | INTRAMUSCULAR | Status: DC | PRN
  Administered 2012-05-11 – 2012-05-12 (×3): 2 mg via INTRAVENOUS
  Filled 2012-05-11 (×3): qty 1

## 2012-05-11 MED ORDER — PROPOFOL 10 MG/ML IV EMUL
INTRAVENOUS | Status: AC
  Filled 2012-05-11: qty 20

## 2012-05-11 MED ORDER — NEOSTIGMINE METHYLSULFATE 1 MG/ML IJ SOLN
INTRAMUSCULAR | Status: DC | PRN
  Administered 2012-05-11: 2 mg via INTRAVENOUS

## 2012-05-11 MED ORDER — ROCURONIUM BROMIDE 100 MG/10ML IV SOLN
INTRAVENOUS | Status: DC | PRN
  Administered 2012-05-11: 10 mg via INTRAVENOUS
  Administered 2012-05-11: 5 mg via INTRAVENOUS
  Administered 2012-05-11 (×2): 10 mg via INTRAVENOUS
  Administered 2012-05-11: 1 mg via INTRAVENOUS
  Administered 2012-05-11: 25 mg via INTRAVENOUS

## 2012-05-11 MED ORDER — ZOLPIDEM TARTRATE 5 MG PO TABS: 5.0000 mg | ORAL_TABLET | Freq: Every evening | ORAL | Status: DC | PRN

## 2012-05-11 MED ORDER — ONDANSETRON HCL 4 MG/2ML IJ SOLN
INTRAMUSCULAR | Status: AC
  Filled 2012-05-11: qty 2

## 2012-05-11 MED ORDER — KETOROLAC TROMETHAMINE 60 MG/2ML IM SOLN
INTRAMUSCULAR | Status: AC
  Filled 2012-05-11: qty 2

## 2012-05-11 MED ORDER — MIDAZOLAM HCL 5 MG/5ML IJ SOLN
INTRAMUSCULAR | Status: DC | PRN
  Administered 2012-05-11: 2 mg via INTRAVENOUS

## 2012-05-11 MED ORDER — GLYCOPYRROLATE 0.2 MG/ML IJ SOLN
INTRAMUSCULAR | Status: AC
  Filled 2012-05-11: qty 2

## 2012-05-11 MED ORDER — EPHEDRINE 5 MG/ML INJ
INTRAVENOUS | Status: AC
  Filled 2012-05-11: qty 10

## 2012-05-11 MED ORDER — KCL IN DEXTROSE-NACL 10-5-0.45 MEQ/L-%-% IV SOLN
INTRAVENOUS | Status: DC
  Filled 2012-05-11 (×2): qty 1000

## 2012-05-11 MED ORDER — STERILE WATER FOR IRRIGATION IR SOLN
Status: DC | PRN
  Administered 2012-05-11: 1000 mL via INTRAVESICAL

## 2012-05-11 MED ORDER — OXYCODONE-ACETAMINOPHEN 5-325 MG PO TABS
1.0000 | ORAL_TABLET | ORAL | Status: DC | PRN
  Administered 2012-05-12 (×3): 2 via ORAL
  Administered 2012-05-13 (×3): 1 via ORAL
  Filled 2012-05-11 (×3): qty 1
  Filled 2012-05-11 (×3): qty 2

## 2012-05-11 MED ORDER — FENTANYL CITRATE 0.05 MG/ML IJ SOLN
INTRAMUSCULAR | Status: AC
  Filled 2012-05-11: qty 2

## 2012-05-11 MED ORDER — HYDROMORPHONE HCL PF 1 MG/ML IJ SOLN
INTRAMUSCULAR | Status: DC | PRN
  Administered 2012-05-11 (×4): 0.5 mg via INTRAVENOUS

## 2012-05-11 MED ORDER — LIDOCAINE HCL (CARDIAC) 20 MG/ML IV SOLN
INTRAVENOUS | Status: DC | PRN
  Administered 2012-05-11: 50 mg via INTRAVENOUS

## 2012-05-11 MED ORDER — FENTANYL CITRATE 0.05 MG/ML IJ SOLN
INTRAMUSCULAR | Status: AC
  Filled 2012-05-11: qty 5

## 2012-05-11 MED ORDER — MIDAZOLAM HCL 2 MG/2ML IJ SOLN: 0.5000 mg | Freq: Once | INTRAMUSCULAR | Status: DC | PRN

## 2012-05-11 MED ORDER — INDIGOTINDISULFONATE SODIUM 8 MG/ML IJ SOLN
INTRAMUSCULAR | Status: AC
  Filled 2012-05-11: qty 5

## 2012-05-11 MED ORDER — FENTANYL CITRATE 0.05 MG/ML IJ SOLN
INTRAMUSCULAR | Status: DC | PRN
  Administered 2012-05-11 (×2): 100 ug via INTRAVENOUS
  Administered 2012-05-11 (×3): 50 ug via INTRAVENOUS

## 2012-05-11 MED ORDER — EPHEDRINE SULFATE 50 MG/ML IJ SOLN
INTRAMUSCULAR | Status: DC | PRN
  Administered 2012-05-11 (×2): 10 mg via INTRAVENOUS

## 2012-05-11 MED ORDER — BUTORPHANOL TARTRATE 1 MG/ML IJ SOLN
1.0000 mg | INTRAMUSCULAR | Status: DC | PRN
  Administered 2012-05-11 (×2): 1 mg via INTRAVENOUS
  Administered 2012-05-12: 2 mg via INTRAVENOUS
  Filled 2012-05-11 (×4): qty 1

## 2012-05-11 MED ORDER — SIMETHICONE 80 MG PO CHEW: 80.0000 mg | CHEWABLE_TABLET | Freq: Four times a day (QID) | ORAL | Status: DC | PRN

## 2012-05-11 MED ORDER — ROCURONIUM BROMIDE 50 MG/5ML IV SOLN
INTRAVENOUS | Status: AC
  Filled 2012-05-11: qty 1

## 2012-05-11 MED ORDER — PROMETHAZINE HCL 25 MG/ML IJ SOLN: 6.2500 mg | INTRAMUSCULAR | Status: DC | PRN

## 2012-05-11 MED ORDER — ONDANSETRON HCL 4 MG/2ML IJ SOLN: 4.0000 mg | Freq: Four times a day (QID) | INTRAMUSCULAR | Status: DC | PRN

## 2012-05-11 MED ORDER — LACTATED RINGERS IV SOLN
INTRAVENOUS | Status: DC
  Administered 2012-05-11 (×5): via INTRAVENOUS

## 2012-05-11 MED ORDER — DEXAMETHASONE SODIUM PHOSPHATE 10 MG/ML IJ SOLN
INTRAMUSCULAR | Status: DC | PRN
  Administered 2012-05-11: 10 mg via INTRAVENOUS

## 2012-05-11 MED ORDER — FENTANYL CITRATE 0.05 MG/ML IJ SOLN: 25.0000 ug | INTRAMUSCULAR | Status: DC | PRN

## 2012-05-11 MED ORDER — IBUPROFEN 800 MG PO TABS
800.0000 mg | ORAL_TABLET | Freq: Three times a day (TID) | ORAL | Status: DC | PRN
  Administered 2012-05-12 – 2012-05-13 (×2): 800 mg via ORAL
  Filled 2012-05-11 (×2): qty 1

## 2012-05-11 MED ORDER — LIDOCAINE HCL (CARDIAC) 20 MG/ML IV SOLN
INTRAVENOUS | Status: AC
  Filled 2012-05-11: qty 5

## 2012-05-11 MED ORDER — KETOROLAC TROMETHAMINE 60 MG/2ML IM SOLN
INTRAMUSCULAR | Status: DC | PRN
  Administered 2012-05-11: 60 mg via INTRAMUSCULAR

## 2012-05-11 SURGICAL SUPPLY — 69 items
BARRIER ADHS 3X4 INTERCEED (GAUZE/BANDAGES/DRESSINGS) IMPLANT
BENZOIN TINCTURE PRP APPL 2/3 (GAUZE/BANDAGES/DRESSINGS) ×4 IMPLANT
BLADE LAPAROSCOPIC MORCELL KIT (BLADE) IMPLANT
CABLE HIGH FREQUENCY MONO STRZ (ELECTRODE) IMPLANT
CANISTER SUCTION 2500CC (MISCELLANEOUS) ×4 IMPLANT
CHLORAPREP W/TINT 26ML (MISCELLANEOUS) ×4 IMPLANT
CLOTH BEACON ORANGE TIMEOUT ST (SAFETY) ×4 IMPLANT
CONT PATH 16OZ SNAP LID 3702 (MISCELLANEOUS) ×4 IMPLANT
COVER MAYO STAND STRL (DRAPES) ×4 IMPLANT
COVER TABLE BACK 60X90 (DRAPES) ×4 IMPLANT
DECANTER SPIKE VIAL GLASS SM (MISCELLANEOUS) IMPLANT
DISSECTOR SPONGE CHERRY (GAUZE/BANDAGES/DRESSINGS) IMPLANT
DRESSING TELFA 8X3 (GAUZE/BANDAGES/DRESSINGS) ×4 IMPLANT
ELECT REM PT RETURN 9FT ADLT (ELECTROSURGICAL) ×4
ELECTRODE REM PT RTRN 9FT ADLT (ELECTROSURGICAL) ×3 IMPLANT
FILTER SMOKE EVAC LAPAROSHD (FILTER) IMPLANT
GAUZE SPONGE 4X4 16PLY XRAY LF (GAUZE/BANDAGES/DRESSINGS) ×4 IMPLANT
GLOVE BIO SURGEON STRL SZ7 (GLOVE) IMPLANT
GLOVE BIO SURGEON STRL SZ7.5 (GLOVE) IMPLANT
GLOVE BIOGEL PI IND STRL 7.0 (GLOVE) ×6 IMPLANT
GLOVE BIOGEL PI IND STRL 8 (GLOVE) ×6 IMPLANT
GLOVE BIOGEL PI INDICATOR 7.0 (GLOVE) ×2
GLOVE BIOGEL PI INDICATOR 8 (GLOVE) ×2
GLOVE ECLIPSE 6.0 STRL STRAW (GLOVE) IMPLANT
GLOVE SURG SS PI 7.0 STRL IVOR (GLOVE) ×8 IMPLANT
GLOVE SURG SS PI 7.5 STRL IVOR (GLOVE) ×8 IMPLANT
GOWN PREVENTION PLUS LG XLONG (DISPOSABLE) ×8 IMPLANT
GOWN PREVENTION PLUS XLARGE (GOWN DISPOSABLE) ×4 IMPLANT
GOWN PREVENTION PLUS XXLARGE (GOWN DISPOSABLE) ×4 IMPLANT
GOWN STRL REIN XL XLG (GOWN DISPOSABLE) ×4 IMPLANT
MANIPULATOR UTERINE 4.5 ZUMI (MISCELLANEOUS) IMPLANT
NS IRRIG 1000ML POUR BTL (IV SOLUTION) ×4 IMPLANT
PACK ABDOMINAL GYN (CUSTOM PROCEDURE TRAY) ×4 IMPLANT
PACK LAPAROSCOPY BASIN (CUSTOM PROCEDURE TRAY) IMPLANT
PACK LAVH (CUSTOM PROCEDURE TRAY) IMPLANT
PAD ABD 7.5X8 STRL (GAUZE/BANDAGES/DRESSINGS) ×4 IMPLANT
PAD OB MATERNITY 4.3X12.25 (PERSONAL CARE ITEMS) ×4 IMPLANT
POUCH SPECIMEN RETRIEVAL 10MM (ENDOMECHANICALS) IMPLANT
PROTECTOR NERVE ULNAR (MISCELLANEOUS) ×4 IMPLANT
SCALPEL HARMONIC ACE (MISCELLANEOUS) IMPLANT
SEALER TISSUE G2 CVD JAW 35 (ENDOMECHANICALS) IMPLANT
SEALER TISSUE G2 CVD JAW 45CM (ENDOMECHANICALS)
SET CYSTO W/LG BORE CLAMP LF (SET/KITS/TRAYS/PACK) ×4 IMPLANT
SET IRRIG TUBING LAPAROSCOPIC (IRRIGATION / IRRIGATOR) IMPLANT
SOLUTION ELECTROLUBE (MISCELLANEOUS) IMPLANT
SPONGE GAUZE 4X4 12PLY (GAUZE/BANDAGES/DRESSINGS) ×4 IMPLANT
SPONGE LAP 18X18 X RAY DECT (DISPOSABLE) ×12 IMPLANT
STAPLER VISISTAT 35W (STAPLE) ×4 IMPLANT
STRIP CLOSURE SKIN 1/2X4 (GAUZE/BANDAGES/DRESSINGS) ×4 IMPLANT
STRIP CLOSURE SKIN 1/4X4 (GAUZE/BANDAGES/DRESSINGS) ×4 IMPLANT
SUT CHROMIC 3 0 SH 27 (SUTURE) ×4 IMPLANT
SUT MON AB 4-0 PS1 27 (SUTURE) IMPLANT
SUT PLAIN 2 0 XLH (SUTURE) IMPLANT
SUT VIC AB 0 CT1 18XCR BRD8 (SUTURE) ×6 IMPLANT
SUT VIC AB 0 CT1 36 (SUTURE) IMPLANT
SUT VIC AB 0 CT1 8-18 (SUTURE) ×2
SUT VIC AB 2-0 CT1 (SUTURE) ×24 IMPLANT
SUT VIC AB 4-0 PS2 27 (SUTURE) ×8 IMPLANT
SUT VICRYL 0 TIES 12 18 (SUTURE) ×4 IMPLANT
SUT VICRYL 0 UR6 27IN ABS (SUTURE) ×12 IMPLANT
SUT VICRYL 4-0 PS2 18IN ABS (SUTURE) IMPLANT
TAPE CLOTH SURG 4X10 WHT LF (GAUZE/BANDAGES/DRESSINGS) ×4 IMPLANT
TOWEL OR 17X24 6PK STRL BLUE (TOWEL DISPOSABLE) ×8 IMPLANT
TRAY FOLEY CATH 14FR (SET/KITS/TRAYS/PACK) ×4 IMPLANT
TROCAR BALLN 12MMX100 BLUNT (TROCAR) ×4 IMPLANT
TROCAR Z-THREAD FIOS 11X100 BL (TROCAR) IMPLANT
TROCAR Z-THREAD FIOS 5X100MM (TROCAR) IMPLANT
WARMER LAPAROSCOPE (MISCELLANEOUS) ×4 IMPLANT
WATER STERILE IRR 1000ML POUR (IV SOLUTION) ×4 IMPLANT

## 2012-05-11 NOTE — Interval H&P Note (Signed)
History and Physical Interval Note:  05/11/2012 10:55 AM  [name] [name]  has presented today for surgery, with the diagnosis of symptomatic fibroids  The various methods of treatment have been discussed with the patient and family. After consideration of risks, benefits and other options for treatment, the patient has consented to  Procedure(s) (LRB) with comments: HYSTERECTOMY ABDOMINAL (N/A) LAPAROSCOPY OPERATIVE (N/A) LAPAROSCOPIC SUPRACERVICAL HYSTERECTOMY (N/A) and now would like to have cervix removed and and understands probable open or abdominal and bilateral salpingo-oophrectomyh as a surgical intervention .  The patient's history has been reviewed, patient examined, no change in status, stable for surgery.  I have reviewed the patient's chart and labs.  Questions were answered to the patient's satisfaction.     Mical Kicklighter H.

## 2012-05-11 NOTE — H&P (View-Only) (Signed)
--------------------------------------------------------------------------------  Steva Colder  46 Y  old  Female, DOB: [birth year]  [address], [place], [id number]  Home: [phone]      Guarantor: Steva Colder    Insurance: [employer]  Referring: Delbert Harness  Appointment Facility: [facility]   -------------------------------------------------------------------------------- 04/28/2012 Waverly Ferrari. Christell Constant, MD    Reason for Appointment  1. Pre-operative      History of Present Illness  here for:          46 year old female presents with c/o here for preoperative visit for possible TAH and possible BSO secondary to enlarged fibroid uterus. I met the patient through the ER at [facility] because of metritis which responded to antibiotics. An [place] at the time revealed                     Uterus: The uterus is heterogeneously enlarged with multiple fibroids. Overall dimensions of the uterus are approximately 15.5 x 11.5 x 11.0 cm. Largest fibroid is noted in the fundus, measuring approximately 8.0 x 7.3 x 9.2 cm. Endometrium: Distorted by the fibroids and suboptimally visualized. No gross endometrial thickening identified. Endometrial thickness is approximately 8 mm. Right ovary: Not well visualized but measuring approximately 3.2 x 1.8 x 1.7 cm. No adnexal mass. Blood flow is seen with color Doppler. Left ovary: Not visualized. Pulsed Doppler evaluation demonstrates normal low-resistance arterial and venous waveforms in the right ovary. The left ovary is not visualized. IMPRESSION: 1. Multiple uterine fibroids as partially imaged on the prior lumbar MRI. 2. The right ovary appears normal and demonstrates normal blood flow. The left ovary is not visualized. 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  The adnexa were within normal limits.   She completed a prolonged course of antibiotics and it has been over 2 months since the inflammation of the PID 03-03-12  .  GYN:          c/o Abdominal pain: intermittent, mild.         c/o Dysmenorrhea: moderate.         c/o Dyspareunia: deep, moderate.         c/o Hot flashes: mild.         c/o Pelvic pain: intermittent, continuous pressure that radiates to back and she has noticed increased frequency.         Denies : Incontinence:.         Itching:.     Current Medications  Ibuprofen 800 MG Tablet 1 tablet Three times a day  Ambien 10 MG Tablet 1 tablet at bedtime as needed Once a day  Cyclobenzaprine HCl 10 MG Tablet 1 tablet Three times a day        Past Medical History  Migraines  Low back pain      Surgical History  Denies Past Surgical History      Family History  Father: alive hypertension   Mother: alive diabetes mellitus   Maternal Grand Mother: diabetes mellitus   Siblings: sister with mesothelioma       Social History  Tobacco Use:         Tobacco Use/Smoking  Are you a  nonsmoker Sexual History:         Sexual History            Had sex in the past 12 months (vaginal, oral, or anal)?  Yes          with  Men only Drugs/Alcohol:         Drugs            Have you used drugs other than those for medical reasons in the past 12 months?  No       Alcohol Screen            Did you have a drink containing alcohol in the past year?  No          Points  0          Interpretation  Negative Miscellaneous:         no Domestic violence.        Marital status: married.        Natural support system: yes.     Gyn History  H/O Periods :  every 28 days and less than 8 days mostly 2-3 days , heavy blood loss.   Last pap smear date  03-06-12 cytology negative and HPV HR negative.   Last mammogram date  3/12 and was WNL and was scheduled to  have another one but could not get done before the procedure and Clinical exam is WNL and SBE per pt has no changes.   Denies H/O Sexually Transmitted Diseases (STDs)  none.   H/O Dysmenorrhea  yes.   Hysterectomy  desires hysterectomy.      OB History  Total pregnancies  G3 P2-0-1-2 with 2 prior vaginal deliveries.      Allergies  Penicillin : hives  shrimp: anaphylaxis  oatmeal: anaphylaxis  coconut: hives      Hospitalization/Major Diagnostic Procedure  PID 8/13      Review of Systems  General/Constitutional:          Patient denies change in appetite, chills, fatigue, fever, night sweats, lightheadedness, , weight gain, weight loss.  Patient complaining of headache.  Change in appetite denies.  Chills denies.  Fatigue denies.  Fever denies.  Headache admits.  Lightheadedness denies.  Night sweats denies.  Sleep disturbance denies.      Ophthalmologic:          Denies Blurred vision.  Denies Diminished visual acuity.  Denies Discharge.  Denies Dry eye.      ENT:          Denies Decreased hearing.  Denies Difficulty swallowing.  Denies Dry mouth.  Denies Ear pain.  Denies Ringing in the ears.      Endocrine:          Denies Cold intolerance.  Denies Difficulty sleeping.  Denies Dizziness.  Admits Excessive sweating.  Denies Heat intolerance.      Respiratory:          Denies Breathing pattern.  Denies Chest pain.  Denies Cough.  Denies Hemoptysis.  Denies Shortness of breath at rest.  Denies Sputum production.  Denies Wheezing.      Breast:          Denies Breast lump.  Denies Breast swelling.  Denies Fever.  Denies Nipple discharge.      Cardiovascular:          Denies Chest pain at rest.  Denies Chest pain with exertion.  Denies Claudication.  Denies Cyanosis.  Denies Difficulty laying flat.  Denies Dizziness.  Denies Irregular heartbeat.  Denies Palpitations.  Denies Shortness of breath.      Gastrointestinal:          Denies Blood in stool.  Admits Constipation, admits.   Diarrhea denies.  Denies Hematemesis.      Hematology:          Denies Breast lump.  Denies Groin mass.  Denies Recent transfusion.  Denies Weakness.      Women Only:          Denies Breast lump.  Denies Breast pain.  Admits Hot flashes.  Denies Irregular menses.  Denies Vaginal bleeding between periods.      Genitourinary:          Denies Blood in urine.  Denies Difficulty urinating.  Denies Pain in lower back.  Denies Painful urination.      Musculoskeletal:          Denies Leg cramps.  Denies Muscle aches.      Skin:          Denies Blistering of skin.  Denies Discoloration.  Denies Hives.  Denies Itching.  Denies Rash.  Denies Skin lesion(s).      Neurologic:          Denies Balance difficulty.  Denies Difficulty speaking.  Denies Dizziness.  Denies Fainting.  Denies Gait abnormality.  Denies Tingling/Numbness.  Denies Transient loss of vision.      Psychiatric:          Denies Anxiety.  Denies Delusions.  Denies Depressed mood.  Denies Eating disorder.  Denies Suicidal thoughts.      admits to low back pain.    Vital Signs  HR 84 /min, BP 116/68 mm Hg, Wt 182 lbs, RR 19 /min, Wt-kg 82.55 kg.    Examination  General Examination:        GENERAL APPEARANCE: in no acute distress, well developed, well nourished.          HEAD: normocephalic, atraumatic.          EYES: pupils equal, round, reactive to light.          EARS: normal.          NOSE: nares patent.          ORAL CAVITY: mucosa moist, good dentition, tongue in midline.          THROAT: normal, no exudate, no erythema.          NECK/THYROID: normal, neck supple, neck supple, full range of motion, no cervical lymphadenopathy, no thyromegaly.          LYMPH NODES: no axillary, supraclavicular or inguinal adenopathy.          SKIN: normal, no rashes, no suspicious lesions.          HEART: normal, no murmurs.          LUNGS: normal, good air movement, no wheezes, rales, rhonchi.          CHEST: clear to auscultation and  percussion.          BREASTS: not examined.          ABDOMEN: normal, liver nontender, bowel sounds present, no guarding or rigidity, very large mass seen in the midline at about 18 weeks.          RECTAL: no problems on visual inspection.          FEMALE GENITOURINARY: normal, chaperone in room, External genitalia, bulbourethral and Skene  glands are within normal limits, labia without lesions or masses, Speculum exam:, vaginal mucosa pink, no lesions, vaginal discharge clear, vaginal discharge white, cervix without lesions, nontender, bimanual exam, unable to fully evaluate adnexa and the mass is 18 weeks in size.          EXTREMITIES: no edema, normal, full range of motion.          NEUROLOGIC: nonfocal, alert and oriented, gait normal.       Assessments  1. Leiomyoma of uterus, unspecified - 218.9 (Primary)  2. Dysmenorrhea - 625.3    h/o PID.    Treatment  1. Leiomyoma of uterus, unspecified   proceed with diagnostic laparoscopy as the anatomy may permit a laparoscopic approach but will not know until we place the camera and will have a significant chance of a open or abdominal hysterectomy we discussed the use of lupron and of continued NSAID usage and versus myomectomy versus hysterectomy and +/- ovarian removal.        Surgery counseling:  The patient is aware that the risks specific to the procedure are that there is a potential for bleeding and the possibility of a transfusion. She realizes that this procedure will make her sterile. There is a potential for damage to other intra-abdominal organs to include ovaries, tubes, bladder, ureters, and bowel, sometimes recognized at the time of surgery and repaired at that time with a potential for a larger incision and/or a second surgeon. The patient is also aware of the fact that these may not be discovered until later date and that she would need to let us know about any postoperative complications by calling me, the office or proceeding to  the emergency room. If these complications are not found at the time of surgery and are discovered later date they may need surgical repair at that time. The patient is also aware the potential for infection and the potential for prolonged hospital stay and the potential for antibiotics on an inpatient and/or outpatient basis. Patient is aware of the potential for treating her pain and being free of pain, but the pain may not be cured with this surgery and the pain may may be worsened or new after the surgery. This could be because of scar tissue or the result of healing post procedure or the result that the pain is related to another potential etiology. She understands the pelvic pain is complex with the potential for gynecological, musculoskeletal, neurological, urological and gastrointestinal etiologies, however, we think that the pain is related to the pathology that is gynecological and is the results of her endometriosis and most recent US that revealed an  Enlarged fibroid uterus. She understands the above and wishes to proceed with the procedure.   The patient is also aware that we will attempt of procedure with minimally invasive technique. This is a laparoscopic hysterectomy. However, the patient also understands that if for some reason there is a judgment that would occur during the procedure to convert this to a open procedure or a laparotomy this would have to be done.  She is well aware that this is probably as high as 50% because of the PID history and the size of the uterus.    the ERT discussion: The patient may change her mind and not have her ovaries removed but in case this is necessary for any reason. Both ovaries are to be removed thus making the patient menopausal. She understands this will be a naturally occurring phenomena and with the  average age of 48 years old for menopause. She also understands that we will have to deal with it potential symptoms of menopause. She is aware that  that potential could be estrogen replacement therapy she has now been made aware of the increased risk of clotting and stroke with the use of estrogen therapy. She is aware of the fact that this may take several months after her surgery to get her hormonal balance where she is satisfied.      Transfusion counseling:  The patient is aware of the possibility with this procedure of the use of blood replacement products in the form of a transfusion. The patient is aware of the risks benefits and alternatives of these. She is aware that we would only use these on an emergency basis or sometimes if considered to significantly reduce the risk of transfusion requirement during surgery. The patient is aware of the risk of the each unit of transfusion having a risk of HIV being approximately 1 in 1 million and having a risk of hepatitis being 1 in 2000, with sometimes the development of chronic active hepatitis bleeding to the potential for liver failure and liver transplant, and having a low substantial risk of transfusion reaction were her body would reject the replacement when we thought she needed it. The patient understands them wishes to proceed.  The patient was counseled regarding the risks, benefits, and alternatives of estrogen replacement therapy. The risks include, but not completely limited to the increased risk of deep vein thrombosis, pulmonary embolus, and stroke. These risks are thought to be mitigated by a baby aspirin per day, which is recommended to this patient and also the use of topical estrogen. The benefits clearly are for the prevention and/or treatment of osteoporosis, osteopenia and bone loss. Other benefits include decreased climacteric/menopausal symptoms of hot flashes, night sweats, mood, lability, insomnia, and vaginal dryness. Alternatively, there is no life threatening reason to start estrogen therapy. Certainly there are medications to treat osteopenia and osteoporosis and there are  bone building exercises of weightbearing and supplemental calcium and vitamin D to help decrease bone loss. There are also holistic and over-the-counter medicines that can mitigate the symptoms of menopause. Certainly, vaginal dryness can be treated with topical estrogen and decrease the risks of systemic estrogen.  Breast tenderness can result as well as abnormal or postmenopausal bleeding. Certainly the use of progesterone can prevent potential for endometrial cancer. Since the patient will not have a uterus both estrogen and progesterone would not have to be used.  Certainly the goal is to find the lowest effective dose to satisfy symptoms and maintain bone density. This can be accomplished over the course of several months or years with the goal being to individualize the therapy.       Procedure Codes  Preop Pre-op Visit No charge              Electronically signed by Filbert Berthold , MD on 05/10/2012 at 06:17 PM EST  Sign off status: Pending     --------------------------------------------------------------------------------  [facility] [address] [place], [place] [id number] Tel: [phone] Fax: [phone]         --------------------------------------------------------------------------------   Patient: Madisen, Ludvigsen    DOB: 1965/11/09     Progress Note: Waverly Ferrari. Christell Constant, MD    04/28/2012    Note generated by eClinicalWorks EMR/PM Software ([email or web address])

## 2012-05-11 NOTE — Anesthesia Preprocedure Evaluation (Addendum)
Anesthesia Evaluation  Patient identified by MRN, date of birth, ID band Patient awake    Reviewed: Allergy & Precautions, H&P , Patient's Chart, lab work & pertinent test results, reviewed documented beta blocker date and time   History of Anesthesia Complications Negative for: history of anesthetic complications  Airway Mallampati: II TM Distance: >3 FB Neck ROM: full    Dental No notable dental hx.    Pulmonary neg pulmonary ROS,  breath sounds clear to auscultation  Pulmonary exam normal       Cardiovascular Exercise Tolerance: Good negative cardio ROS  Rhythm:regular Rate:Normal     Neuro/Psych  Headaches, negative neurological ROS  negative psych ROS   GI/Hepatic negative GI ROS, Neg liver ROS,   Endo/Other  negative endocrine ROS  Renal/GU negative Renal ROS     Musculoskeletal   Abdominal   Peds  Hematology negative hematology ROS (+)   Anesthesia Other Findings   Reproductive/Obstetrics negative OB ROS                         Anesthesia Physical Anesthesia Plan  ASA: II  Anesthesia Plan: General ETT   Post-op Pain Management:    Induction:   Airway Management Planned:   Additional Equipment:   Intra-op Plan:   Post-operative Plan:   Informed Consent: I have reviewed the patients History and Physical, chart, labs and discussed the procedure including the risks, benefits and alternatives for the proposed anesthesia with the patient or authorized representative who has indicated his/her understanding and acceptance.   Dental Advisory Given  Plan Discussed with: CRNA and Surgeon  Anesthesia Plan Comments:      position patient awake Anesthesia Quick Evaluation

## 2012-05-11 NOTE — Anesthesia Postprocedure Evaluation (Signed)
Anesthesia Post Note  Patient: [name] [name]  Procedure(s) Performed: Procedure(s) (LRB): HYSTERECTOMY ABDOMINAL (N/A) CYSTOSCOPY (N/A)  Anesthesia type: General  Patient location: PACU  Post pain: Pain level controlled  Post assessment: Post-op Vital signs reviewed  Last Vitals:  Filed Vitals:   05/11/12 1610  BP: 125/83  Pulse: 95  Temp: 36.8 C  Resp: 16    Post vital signs: Reviewed  Level of consciousness: sedated  Complications: No apparent anesthesia complications

## 2012-05-11 NOTE — Transfer of Care (Signed)
Immediate Anesthesia Transfer of Care Note  Patient: [name] [name]  Procedure(s) Performed: Procedure(s) (LRB) with comments: HYSTERECTOMY ABDOMINAL (N/A) CYSTOSCOPY (N/A)  Patient Location: PACU  Anesthesia Type:General  Level of Consciousness: awake, oriented and patient cooperative  Airway & Oxygen Therapy: Patient Spontanous Breathing and Patient connected to nasal cannula oxygen  Post-op Assessment: Report given to PACU RN and Post -op Vital signs reviewed and stable  Post vital signs: Reviewed and stable  Complications: No apparent anesthesia complications

## 2012-05-11 NOTE — Op Note (Addendum)
05/11/2012  2:23 PM  PATIENT:  [name] [name]  46 y.o. female  PRE-OPERATIVE DIAGNOSIS:  symptomatic fibroid uterus, h/o PID  POST-OPERATIVE DIAGNOSIS:  symptomatic fibroid uterus, h/o PID, small bowel adhesions  PROCEDURE:  Procedure(s): EUA ENTEROLYSIS HYSTERECTOMY ABDOMINAL TOTAL with BILATERAL SALPINGO-OOPHORECTOMY CYSTOSCOPY  SURGEON:  Waverly Ferrari. Christell Constant, MD PHYSICIAN ASSISTANT: Dr. Neva Seat, MD  ASSISTANTS: none   ANESTHESIA:   general  EBL:  Total I/O In: 2000 [I.V.:2000] Out: 925 [Urine:300; Blood:625] EBL 325 BLOOD ADMINISTERED:none  DRAINS: Urinary Catheter (Foley)   LOCAL MEDICATIONS USED:  none    SPECIMEN:  Source of Specimen:  uterus with attached tubes and ovaries bilaterally and amputated cervix  DISPOSITION OF SPECIMEN:  PATHOLOGY  COUNTS:  YES  TOURNIQUET:  * No tourniquets in log *  DICTATION: .Dragon Dictation  PLAN OF CARE: Admit to inpatient   PATIENT DISPOSITION:  PACU - hemodynamically stable.   Delay start of Pharmacological VTE agent (>24hrs) due to surgical blood loss or risk of bleeding: mechanical prophylaxis is ongoing  FINDINGS: Exam under anesthesia revealed that the uterine fibroids were 2 enlarged and extended from sidewall to sidewall and did not allow for much manipulation on EUA.  The decision was made to go ahead and proceed with abdominal hysterectomy secondary to this.  Upon laparotomy it was noted that the small bowel was adhered to the right fundus.  Approximately 12 cm knuckle of bowel was adherent to each other and to the uterine fundus.  There were several dark areas in the peritoneum consistent with an old hematoma.  There was no evidence of a fistula there was no evidence of bowel wall injury.  The multilobular uterus was able to be elevated through the incision to allow for visualization of the normal anatomy.  Good hemostasis was noted along the way.  Upon fundectomy good hemostasis was obtained.  The specimen was  passed off for histopathological diagnosis.  The ureters were noted peristalsing well lateral to and posterior to the placement of the hemostatic clamps.  The good hemostasis was noted post procedure.  Good fascial integrity noted upon closure.  Cystoscopy was performed and revealed good bilateral ureteral reflux of blue indigo carmine dye the bladder mucosa was intact.  The ovaries and tubes appear to be within normal limits      INDICATIONS: This is a 46 year old parous female that presented initially with an episode of PID/metritis.  A CT scan and ultrasound revealed a 16-18 week size fibroid uterus.  There were no adnexal masses.  There were several uterine fibroids please see history and physical for further details.  The initial thought was the potential to proceed with laparoscopy approach was possible however the patient is well aware the fact that may have to do an open procedure.  On exam under anesthesia today this was clearly evident.  Patient chose to have her cervix removed and also chose to have both ovaries and tubes removed at this time.  DESCRIPTION OF PROCEDURE: The patient was met in the preoperative holding area and the procedure was confirmed and consents were noted to be accurate.  The plan of care was once again discussed with patient and she agreed.  Patient was admitted to the operating room where she was placed in the supine position and underwent general anesthetic induction.  The patient then underwent exam under anesthesia with above findings The patient then  underwent sterile prep and drape.  The patient then underwent active time out to match patient with procedure.  Pfannenstiel entrance into the abdomen was performed in the routine fashion with use of Mayo scissors and also Bovie electrocautery.  Once in the abdomen the above findings were noted.  Enterolysis was performed for approximately 30 minutes.  At this point the uterus was unable to be elevated more  so out of the incision.  Allow for adequate visualization of all pedicles.  The right round ligament was ligated and divided.  The anterior leaf of broad ligament was then divided to the anterior cervix.  Same was performed posterior.  This allowed for skeletonization of the infundibulopelvic ligament.  This was then crossclamped x2.  A clamp was placed across the side of the uterus to control backbleeding.  The infundibulopelvic ligament was then divided and secured with a free tie, and a fore and aft stitch.  The 2-0 Vicryl was utilized.  The same was performed on the contralateral side.  This point the left uterine vessels were skeletonized crossclamped and divided and secured.  The same was performed on the contralateral side back bleeding had to be controlled with straight clamps along the side of the lower uterine segment to the level of this division of the cardinal ligament.  A fundectomy was then performed mainly with Bovie electrocautery.  The specimen was passed off.  The cervix was then removed with a series of straight clamps placed just adjacent to the cervix.  At the level of the corner of the vagina and cervix this was crossclamped with a curved clamp.  This was then divided to remove the cervix.  The resulting colpotomy was then reapproximated with 2-0 Vicryl.  The bladder flap had been previously developed to be inferior to this.  Good hemostasis was noted and were was not good electrocautery was lightly applied.  On the anterior portion of the vaginal cuff a 2-0 chromic on an SH needle was placed to secure hemostasis at one point of one blood vessel.  Close to the bladder so a chromic stitch was used.  The pelvis copiously irrigated.  All pedicles were inspected and noted to be hemostatic  A Balfour retractor had been placed previously and this was removed at this time as well as the resulting laparotomy sponges that had been used to pack the bowel anteriorly.  The of was removed at this  time.  Sponges were placed on the lateral blades of these removed as well.  The Deaver blade and then used to retract the bladder and sidewall otherwise.  The bowel was once again inspected at the end of the lysis sites were noted to be intact good bowel integrity.  Even a loop of bowel that was adhered to itself was divided.  The subfascial tissues were inspected to be hemostatic.  Fascia is reapproximated with 0 Vicryl in a running fashion with 2 separate sutures tied together the right one third margin.  He was noted.  Subcutaneous tissue was noted to be approximately 4 cm in depth and the decision was made to go ahead and close the subcutaneous fat.  20 plain gut was utilized to reapproximate the subcutaneous fat. The subcutaneous tissues that had been previously irrigated and of lesions and made hemostatic with electrocautery.  The skin was then closed with 4 Monocryl in a subcuticular fashion.  The wound was appropriately dressed.  The cystoscope was inserted into the bladder the normal saline as the distending media.  The above findings were noted.  The 70 for oblique scope was utilized.  This point patient was  replaced supine position a to be a problem for the cystoscope.  The patient was reversed from general anesthesia to recovery awake and stable condition.  End

## 2012-05-11 NOTE — Preoperative (Signed)
Beta Blockers   Reason not to administer Beta Blockers:Not Applicable 

## 2012-05-12 ENCOUNTER — Encounter (HOSPITAL_COMMUNITY): Payer: Self-pay | Admitting: Obstetrics & Gynecology

## 2012-05-12 LAB — CBC
HCT: 31.4 % — ABNORMAL LOW (ref 36.0–46.0)
Hemoglobin: 10.3 g/dL — ABNORMAL LOW (ref 12.0–15.0)
MCH: 26.4 pg (ref 26.0–34.0)
MCHC: 32.8 g/dL (ref 30.0–36.0)
MCV: 80.5 fL (ref 78.0–100.0)
RBC: 3.9 MIL/uL (ref 3.87–5.11)

## 2012-05-12 NOTE — Anesthesia Postprocedure Evaluation (Signed)
  Anesthesia Post-op Note  Patient: [name] [name]  Procedure(s) Performed: Procedure(s) (LRB) with comments: HYSTERECTOMY ABDOMINAL (N/A) CYSTOSCOPY (N/A)  Patient Location: [facility]  Anesthesia Type:General  Level of Consciousness: awake, alert , oriented and patient cooperative  Airway and Oxygen Therapy: Patient Spontanous Breathing and Patient connected to nasal cannula oxygen  Post-op Pain: none  Post-op Assessment: Post-op Vital signs reviewed and Patient's Cardiovascular Status Stable  Post-op Vital Signs: Reviewed and stable  Complications: No apparent anesthesia complications

## 2012-05-12 NOTE — Progress Notes (Signed)
UR Chart review completed.  

## 2012-05-12 NOTE — Progress Notes (Addendum)
1 Day Post-Op Procedure(s) (LRB): HYSTERECTOMY ABDOMINAL (N/A) BSO enterolysis CYSTOSCOPY (N/A)  Subjective: Patient reports incisional pain.  And back pain and can not tell the difference in her pain and states she was up most of the night in pain and nurse reports she slept well and we discussed the fact she was now in rehab and will need to move today  Objective: I have reviewed patient's vital signs, intake and output, medications and labs.  CBC    Component Value Date/Time   WBC 15.4* 05/12/2012 0545   RBC 3.90 05/12/2012 0545   HGB 10.3* 05/12/2012 0545   HCT 31.4* 05/12/2012 0545   PLT 315 05/12/2012 0545   MCV 80.5 05/12/2012 0545   MCH 26.4 05/12/2012 0545   MCHC 32.8 05/12/2012 0545   RDW 13.8 05/12/2012 0545   LYMPHSABS 2.0 03/03/2012 0550   MONOABS 1.5* 03/03/2012 0550   EOSABS 0.0 03/03/2012 0550   BASOSABS 0.0 03/03/2012 0550   Temp:  [97.8 F (36.6 C)-98.4 F (36.9 C)] 98.1 F (36.7 C) (11/05 0527) Pulse Rate:  [90-100] 96  (11/05 0527) Resp:  [12-18] 16  (11/05 0527) BP: (111-154)/(61-95) 140/89 mmHg (11/05 0527) SpO2:  [97 %-100 %] 98 % (11/05 0527) Weight:  [83.008 kg (183 lb)] 83.008 kg (183 lb) (11/04 1610)  Scheduled Meds:   . [COMPLETED] ciprofloxacin  400 mg Intravenous 30 min Pre-Op  . [COMPLETED] metronidazole  500 mg Intravenous 30 min Pre-Op  . pantoprazole (PROTONIX) IV  40 mg Intravenous QHS   Continuous Infusions:   . dextrose 5 %-0.45% nacl with kcl 125 mL/hr at 05/12/12 0514  . [DISCONTINUED] dextrose 5 % and 0.45 % NaCl with KCl 10 mEq/L    . [DISCONTINUED] lactated ringers    . [DISCONTINUED] lactated ringers    . [DISCONTINUED] lactated ringers     PRN Meds:.butorphanol, ibuprofen, morphine injection, ondansetron (ZOFRAN) IV, ondansetron, oxyCODONE-acetaminophen, simethicone, zolpidem, [DISCONTINUED] 0.9 % irrigation (POUR BTL), [DISCONTINUED] fentaNYL, [DISCONTINUED] ketorolac, [DISCONTINUED] meperidine (DEMEROL) injection, [DISCONTINUED]  midazolam, [DISCONTINUED] promethazine, [DISCONTINUED] sterile water for irrigation         General: alert, cooperative and mild distress Resp: clear to auscultation bilaterally Cardio: regular rate and rhythm, S1, S2 normal, no murmur, click, rub or gallop GI: incision: clean, dry and intact and appropriately tender and the abd is nonacute without guarding Extremities: extremities normal, atraumatic, no cyanosis or edema and Homans sign is negative, no sign of DVT  Assessment: s/p Procedure(s) (LRB) with comments: HYSTERECTOMY ABDOMINAL (N/A) CYSTOSCOPY (N/A): stable  Plan: Encourage ambulation will see how the day goes as her enterolysis was extensive   LOS: 1 day    Lajada Janes H. 05/12/2012, 7:29 AM

## 2012-05-12 NOTE — Addendum Note (Signed)
Addendum  created 05/12/12 1902 by Orlie Pollen, CRNA   Modules edited:Notes Section

## 2012-05-13 MED ORDER — OXYCODONE-ACETAMINOPHEN 5-325 MG PO TABS: 1.0000 | ORAL_TABLET | ORAL | Status: DC | PRN

## 2012-05-13 MED ORDER — PANTOPRAZOLE SODIUM 40 MG PO TBEC: 40.0000 mg | DELAYED_RELEASE_TABLET | Freq: Every day | ORAL | Status: DC

## 2012-05-13 NOTE — Progress Notes (Signed)
The patient is receiving protonix by the intravenous route.  Based on criteria approved by the Pharmacy and Therapeutics Committee and the Medical Executive Committee, the medication is being converted to the equivalent oral dose form.  These criteria include: -No Active GI bleeding -Able to tolerate diet of full liquids (or better) or tube feeding -Able to tolerate other medications by the oral or enteral route  If you have any questions about this conversion, please contact the Pharmacy Department (ext [phone]).  Thank you.   Natasha Bence 05/13/2012

## 2012-05-13 NOTE — Progress Notes (Signed)
Patient ID: [name] [name], female   DOB: [birth year], 46 y.o.   MRN: [id number] See DC summary

## 2012-05-13 NOTE — Discharge Summary (Signed)
Physician Discharge Summary  Patient ID: [name] [name] MRN: [id number] DOB/AGE: Oct 19, 1965 46 y.o.  Admit date: 05/11/2012 Discharge date: 05/13/2012  Admission Diagnoses:fibroid uterus   Discharge Diagnoses: same Active Problems:  * No active hospital problems. *    Discharged Condition: stable  Hospital Course: Pt admitted for probable TAH BSO and the EUA confirmed this and the pt underwent TAH BSO with extensive enterolysis and is doing well with tolerating po and abdomen non acute and tolerating ambulating and voiding and po pain control  Consults: None  Significant Diagnostic Studies: pathology pending  Treatments: surgery: TAH BSO ENTEROLYSIS and CYSTOSCOPY  Discharge Exam: Blood pressure 124/82, pulse 98, temperature 98.2 F (36.8 C), temperature source Oral, resp. rate 18, height 5\' 4"  (1.626 m), weight 83.008 kg (183 lb), SpO2 100.00%. General appearance: alert, cooperative and no distress GI: normal findings: no masses palpable, soft, non-tender and incision clean dry and intact Extremities: extremities normal, atraumatic, no cyanosis or edema and Homans sign is negative, no sign of DVT  Disposition: 01-Home or Self Care  CBC    Component Value Date/Time   WBC 15.4* 05/12/2012 0545   RBC 3.90 05/12/2012 0545   HGB 10.3* 05/12/2012 0545   HCT 31.4* 05/12/2012 0545   PLT 315 05/12/2012 0545   MCV 80.5 05/12/2012 0545   MCH 26.4 05/12/2012 0545   MCHC 32.8 05/12/2012 0545   RDW 13.8 05/12/2012 0545   LYMPHSABS 2.0 03/03/2012 0550   MONOABS 1.5* 03/03/2012 0550   EOSABS 0.0 03/03/2012 0550   BASOSABS 0.0 03/03/2012 0550   Temp:  [97.9 F (36.6 C)-98.2 F (36.8 C)] 98.2 F (36.8 C) (11/06 0520) Pulse Rate:  [84-116] 98  (11/06 0955) Resp:  [16-18] 18  (11/06 0520) BP: (115-148)/(72-90) 124/82 mmHg (11/06 0900) SpO2:  [97 %-100 %] 100 % (11/06 0900)  Scheduled Meds:   . pantoprazole  40 mg Oral QHS  . [DISCONTINUED] pantoprazole (PROTONIX) IV  40 mg Intravenous  QHS   Continuous Infusions:   . dextrose 5 %-0.45% nacl with kcl 125 mL/hr at 05/13/12 0720   PRN Meds:.butorphanol, ibuprofen, morphine injection, ondansetron (ZOFRAN) IV, ondansetron, oxyCODONE-acetaminophen, simethicone, zolpidem     Discharge Orders    Future Orders Please Complete By Expires   Diet - low sodium heart healthy      Increase activity slowly      Discharge instructions      Comments:   Routine per protocol   Driving Restrictions      Comments:   No driving for 2 weeks   Lifting restrictions      Comments:   No heavy lifting nothing you can not talk through while lifting   Sexual Activity Restrictions      Comments:   None for 6 weeks   Discharge wound care:      Comments:   Given to call with problems   Call MD for:  extreme fatigue      Call MD for:  persistant dizziness or light-headedness      Call MD for:  hives      Call MD for:  difficulty breathing, headache or visual disturbances      Call MD for:  redness, tenderness, or signs of infection (pain, swelling, redness, odor or green/yellow discharge around incision site)      Call MD for:  severe uncontrolled pain      Call MD for:  persistant nausea and vomiting      Call MD for:  temperature >100.4  Medication List     As of 05/13/2012 11:41 AM    TAKE these medications         aspirin-acetaminophen-caffeine 250-250-65 MG per tablet   Commonly known as: EXCEDRIN MIGRAINE   Take 1 tablet by mouth every 6 (six) hours as needed. For headache      cyclobenzaprine 15 MG 24 hr capsule   Commonly known as: AMRIX   Take 15 mg by mouth daily.      Ibuprofen-Famotidine 800-26.6 MG Tabs   Take 1 tablet by mouth 3 (three) times daily.      methocarbamol 500 MG tablet   Commonly known as: ROBAXIN   Take 500 mg by mouth 3 (three) times daily.      multivitamin with minerals Tabs   Take 1 tablet by mouth daily.      oxyCODONE-acetaminophen 5-325 MG per tablet   Commonly known as:  PERCOCET/ROXICET   Take 1 tablet by mouth every 4 (four) hours as needed for pain.         SignedDelbert Harness. 05/13/2012, 11:41 AM

## 2013-10-06 ENCOUNTER — Emergency Department (HOSPITAL_COMMUNITY)
Admission: EM | Admit: 2013-10-06 | Discharge: 2013-10-06 | Disposition: A | Discharge status: Home / Self Care | Payer: BC Managed Care – PPO | Attending: Emergency Medicine | Admitting: Emergency Medicine

## 2013-10-06 ENCOUNTER — Emergency Department (HOSPITAL_COMMUNITY): Payer: BC Managed Care – PPO

## 2013-10-06 ENCOUNTER — Encounter (HOSPITAL_COMMUNITY): Payer: Self-pay | Admitting: Emergency Medicine

## 2013-10-06 DIAGNOSIS — Z880 Allergy status to penicillin: Secondary | ICD-10-CM | POA: Insufficient documentation

## 2013-10-06 DIAGNOSIS — R0602 Shortness of breath: Secondary | ICD-10-CM | POA: Insufficient documentation

## 2013-10-06 DIAGNOSIS — R110 Nausea: Secondary | ICD-10-CM | POA: Insufficient documentation

## 2013-10-06 DIAGNOSIS — R071 Chest pain on breathing: Secondary | ICD-10-CM | POA: Insufficient documentation

## 2013-10-06 DIAGNOSIS — R059 Cough, unspecified: Secondary | ICD-10-CM | POA: Insufficient documentation

## 2013-10-06 DIAGNOSIS — Z91040 Latex allergy status: Secondary | ICD-10-CM | POA: Insufficient documentation

## 2013-10-06 DIAGNOSIS — R05 Cough: Secondary | ICD-10-CM | POA: Insufficient documentation

## 2013-10-06 DIAGNOSIS — G43909 Migraine, unspecified, not intractable, without status migrainosus: Secondary | ICD-10-CM | POA: Insufficient documentation

## 2013-10-06 DIAGNOSIS — Z79899 Other long term (current) drug therapy: Secondary | ICD-10-CM | POA: Insufficient documentation

## 2013-10-06 DIAGNOSIS — R0789 Other chest pain: Secondary | ICD-10-CM

## 2013-10-06 DIAGNOSIS — Z791 Long term (current) use of non-steroidal anti-inflammatories (NSAID): Secondary | ICD-10-CM | POA: Insufficient documentation

## 2013-10-06 LAB — CBC
HCT: 36.3 % (ref 36.0–46.0)
HEMOGLOBIN: 12.1 g/dL (ref 12.0–15.0)
MCH: 26.9 pg (ref 26.0–34.0)
MCHC: 33.3 g/dL (ref 30.0–36.0)
MCV: 80.7 fL (ref 78.0–100.0)
Platelets: 309 10*3/uL (ref 150–400)
RBC: 4.5 MIL/uL (ref 3.87–5.11)
RDW: 14 % (ref 11.5–15.5)
WBC: 7.7 10*3/uL (ref 4.0–10.5)

## 2013-10-06 LAB — I-STAT TROPONIN, ED
TROPONIN I, POC: 0 ng/mL (ref 0.00–0.08)
Troponin i, poc: 0.07 ng/mL (ref 0.00–0.08)

## 2013-10-06 LAB — BASIC METABOLIC PANEL
BUN: 9 mg/dL (ref 6–23)
CHLORIDE: 102 meq/L (ref 96–112)
CO2: 25 mEq/L (ref 19–32)
Calcium: 10.1 mg/dL (ref 8.4–10.5)
Creatinine, Ser: 0.69 mg/dL (ref 0.50–1.10)
GFR calc Af Amer: >90 mL/min (ref >90)
GLUCOSE: 106 mg/dL — AB (ref 70–99)
POTASSIUM: 3.4 meq/L — AB (ref 3.7–5.3)
SODIUM: 140 meq/L (ref 137–147)

## 2013-10-06 LAB — D-DIMER, QUANTITATIVE (NOT AT ARMC): D DIMER QUANT: 0.39 ug{FEU}/mL (ref 0.00–0.48)

## 2013-10-06 LAB — PRO B NATRIURETIC PEPTIDE: Pro B Natriuretic peptide (BNP): 15.1 pg/mL (ref 0–125)

## 2013-10-06 MED ORDER — NITROGLYCERIN 0.4 MG SL SUBL: 0.4000 mg | SUBLINGUAL_TABLET | SUBLINGUAL | Status: DC | PRN

## 2013-10-06 MED ORDER — ONDANSETRON HCL 4 MG/2ML IJ SOLN
4.0000 mg | Freq: Once | INTRAMUSCULAR | Status: AC
  Administered 2013-10-06: 4 mg via INTRAVENOUS
  Filled 2013-10-06: qty 2

## 2013-10-06 MED ORDER — SODIUM CHLORIDE 0.9 % IV SOLN
INTRAVENOUS | Status: DC
  Administered 2013-10-06: 100 mL/h via INTRAVENOUS

## 2013-10-06 MED ORDER — TRAMADOL HCL 50 MG PO TABS: 50.0000 mg | ORAL_TABLET | Freq: Four times a day (QID) | ORAL | Status: AC | PRN

## 2013-10-06 MED ORDER — ASPIRIN 325 MG PO TABS
325.0000 mg | ORAL_TABLET | ORAL | Status: AC
Start: 2013-10-06 — End: 2013-10-06
  Administered 2013-10-06: 325 mg via ORAL
  Filled 2013-10-06: qty 1

## 2013-10-06 MED ORDER — SODIUM CHLORIDE 0.9 % IV BOLUS (SEPSIS)
500.0000 mL | Freq: Once | INTRAVENOUS | Status: AC
  Administered 2013-10-06: 500 mL via INTRAVENOUS

## 2013-10-06 MED ORDER — HYDROMORPHONE HCL PF 1 MG/ML IJ SOLN
1.0000 mg | Freq: Once | INTRAMUSCULAR | Status: AC
  Administered 2013-10-06: 1 mg via INTRAVENOUS
  Filled 2013-10-06: qty 1

## 2013-10-06 NOTE — ED Provider Notes (Addendum)
CSN: [id number]     Arrival date & time 10/06/13  0712 History   First MD Initiated Contact with Patient 10/06/13 [phone]     Chief Complaint  Patient presents with  . Chest Pain     (Consider location/radiation/quality/duration/timing/severity/associated sxs/prior Treatment) The history is provided by the patient.   48 year old female 4 day history of sternal chest pain. Worse with deep breaths. Currently 8/10 sharp in nature. Associated with some shortness of breath and nausea but no vomiting also associated with a nonproductive cough however the cough started just in the last 2 days. Patient's past medical history is negative for any coronary artery disease. Patient does not have any cardiac risk factors.  Past Medical History  Diagnosis Date  . Migraine    Past Surgical History  Procedure Laterality Date  . No past surgeries    . Abdominal hysterectomy  05/11/2012    Procedure: HYSTERECTOMY ABDOMINAL;  Surgeon: Jolayne Haines, MD;  Location: [facility];  Service: Gynecology;  Laterality: N/A;  . Cystoscopy  05/11/2012    Procedure: CYSTOSCOPY;  Surgeon: Jolayne Haines, MD;  Location: [facility];  Service: Gynecology;  Laterality: N/A;   No family history on file. History  Substance Use Topics  . Smoking status: Never Smoker   . Smokeless tobacco: Never Used  . Alcohol Use: No   OB History   Grav Para Term Preterm Abortions TAB SAB Ect Mult Living                 Review of Systems  Constitutional: Negative for fever.  HENT: Negative for congestion.   Respiratory: Positive for cough and shortness of breath.   Cardiovascular: Positive for chest pain.  Gastrointestinal: Positive for nausea. Negative for vomiting and abdominal pain.  Musculoskeletal: Negative for back pain.  Skin: Negative for rash.  Neurological: Negative for syncope and headaches.  Hematological: Does not bruise/bleed easily.  Psychiatric/Behavioral: Negative for confusion.      Allergies  Oatmeal; Shrimp;  Latex; Coconut oil; and Penicillins  Home Medications   Current Outpatient Rx  Name  Route  Sig  Dispense  Refill  . aspirin-acetaminophen-caffeine (EXCEDRIN MIGRAINE) 250-250-65 MG per tablet   Oral   Take 1 tablet by mouth every 6 (six) hours as needed. For headache         . cyclobenzaprine (AMRIX) 15 MG 24 hr capsule   Oral   Take 15 mg by mouth daily.         . Ibuprofen-Famotidine 800-26.6 MG TABS   Oral   Take 1 tablet by mouth 3 (three) times daily.         . methocarbamol (ROBAXIN) 500 MG tablet   Oral   Take 500 mg by mouth 3 (three) times daily.         . Multiple Vitamin (MULTIVITAMIN WITH MINERALS) TABS   Oral   Take 1 tablet by mouth daily.         Marland Kitchen oxyCODONE-acetaminophen (ROXICET) 5-325 MG per tablet   Oral   Take 1 tablet by mouth every 4 (four) hours as needed for pain.   30 tablet   0    BP 124/83  Pulse 78  Temp(Src) 98.2 F (36.8 C)  SpO2 100% Physical Exam  Nursing note and vitals reviewed. Constitutional: She is oriented to person, place, and time. She appears well-developed and well-nourished. No distress.  HENT:  Head: Normocephalic and atraumatic.  Mouth/Throat: Oropharynx is clear and moist.  Eyes: Conjunctivae and EOM  are normal. Pupils are equal, round, and reactive to light.  Neck: Normal range of motion. Neck supple.  Cardiovascular: Normal rate, regular rhythm and normal heart sounds.   No murmur heard. Pulmonary/Chest: Effort normal and breath sounds normal. No respiratory distress. She exhibits tenderness.  Abdominal: Soft. Bowel sounds are normal. There is no tenderness.  Musculoskeletal: Normal range of motion.  Neurological: She is alert and oriented to person, place, and time. No cranial nerve deficit. She exhibits normal muscle tone. Coordination normal.  Skin: Skin is warm.    ED Course  Procedures (including critical care time) Labs Review Labs Reviewed - No data to display Imaging Review No results  found.   EKG Interpretation None       Date: 10/06/2013  Rate: 73  Rhythm: normal sinus rhythm  QRS Axis: normal  Intervals: normal  ST/T Wave abnormalities: nonspecific ST/T changes  Conduction Disutrbances:none  Narrative Interpretation:   Old EKG Reviewed: none available Did not cross over into MUSE    MDM   Final diagnoses:  None    The patient's chest pain seems to be chest wall in nature. Initial workup negative troponin EKG without acute changes. Chest x-ray negative for pneumonia pneumothorax. Patient's had symptoms now for 4 days. We'll treat his chest wall. Will do a followup to our troponin is negative patient can be discharged home. As well d-dimer was negative so not consistent with a pulmonary embolus.    Mervin Kung, MD 10/06/13 1005  Repeat point care troponin not 2 hours apart also negative. Symptoms very consistent with chest wall pain. We'll treat with pain medication and followup with her primary care Dr.  Mervin Kung, MD 10/06/13 1053

## 2013-10-06 NOTE — Discharge Instructions (Signed)

## 2013-10-06 NOTE — ED Notes (Signed)
Zackowski, MD at bedside. 

## 2013-10-06 NOTE — ED Notes (Signed)
Patient transported to X-ray 

## 2013-10-06 NOTE — ED Notes (Signed)
Pt reports taking diet pill Hydroxycut SX-7 for wt. loss

## 2013-10-06 NOTE — ED Notes (Addendum)
Cp since Sunday had some nausea sharp tightness she states called ems this am they did ekg she did not want to come w/ them had surgery in jan  On rt shoulder

## 2013-10-13 IMAGING — US US TRANSVAGINAL NON-OB
1 series · 13 of 25 positions shown · non-contrast
Comparison: Lumbar MRI 01/03/2012.

CLINICAL DATA: Low abdominal pain.  Question ovarian torsion.

TRANSABDOMINAL ULTRASOUND OF PELVIS
DOPPLER ULTRASOUND OF OVARIES
TECHNIQUE: Transabdominal ultrasound examination of the pelvis was
performed including evaluation of the uterus, ovaries, adnexal
regions, and pelvic cul-de-sac.
Color and duplex Doppler ultrasound was utilized to evaluate blood
flow to the ovaries.

[Series 1: us transvaginal non-ob · 0.32mm/px · 13 of 51 slices shown]
[im 1/51]
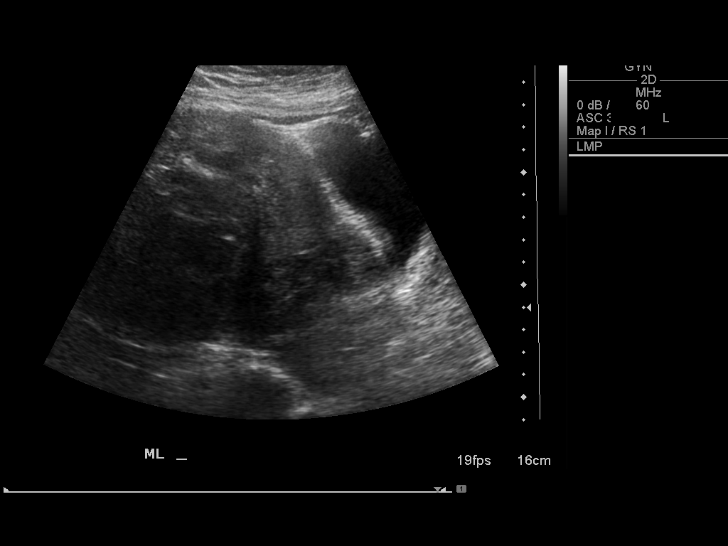
[im 5/51]
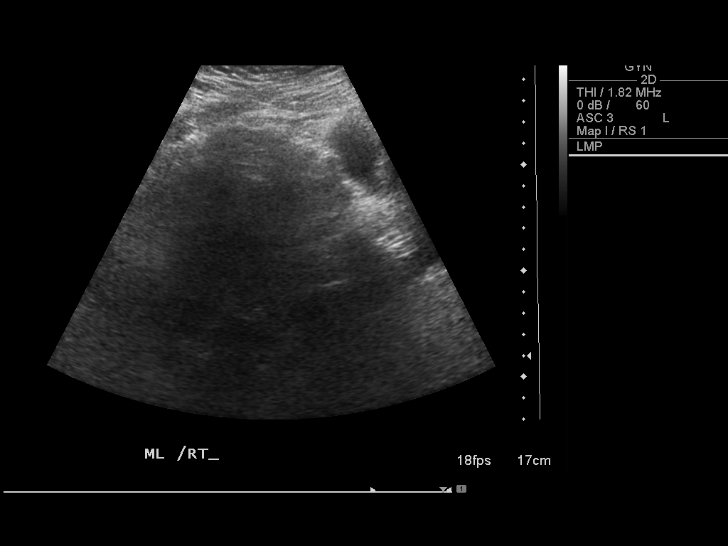
[im 9/51]
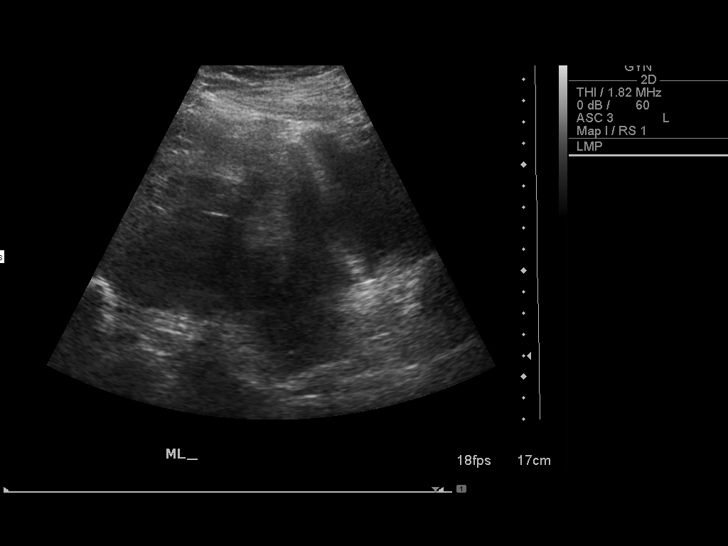
[im 13/51]
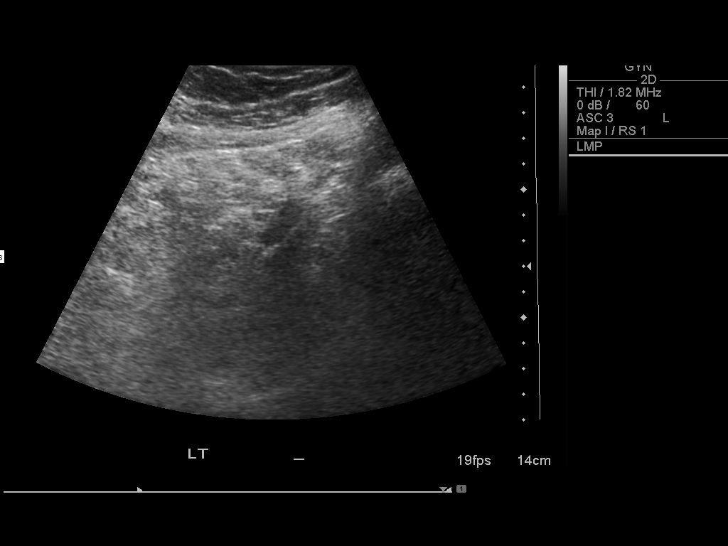
[im 17/51]
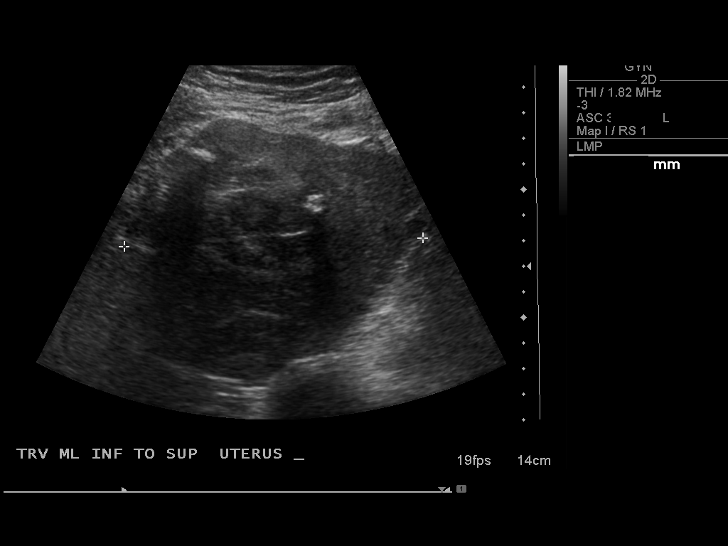
[im 21/51]
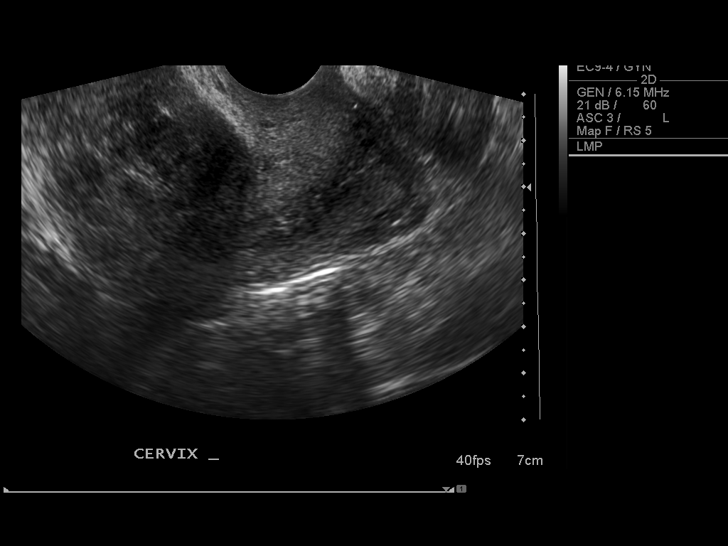
[im 26/51]
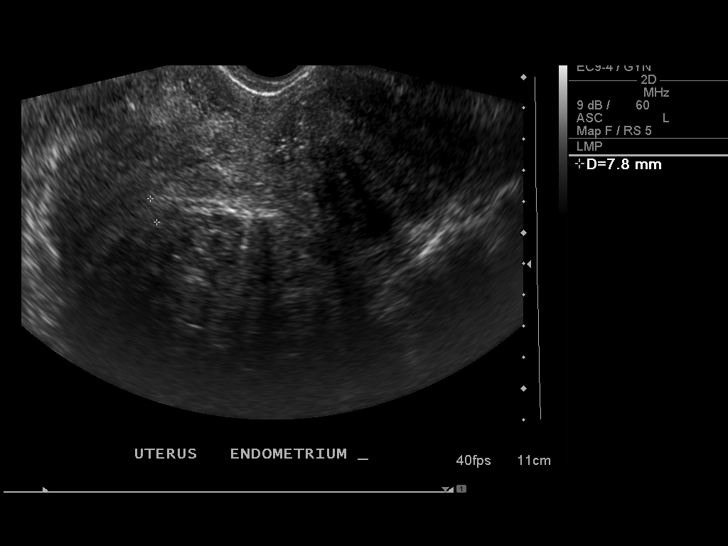
[im 30/51]
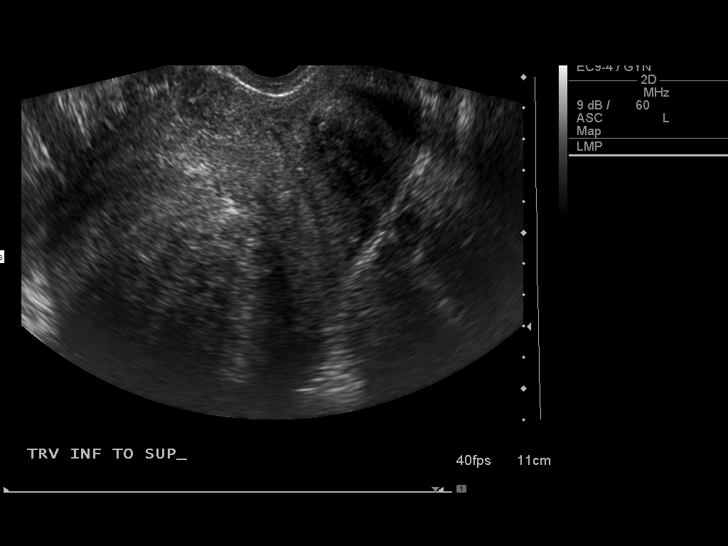
[im 34/51]
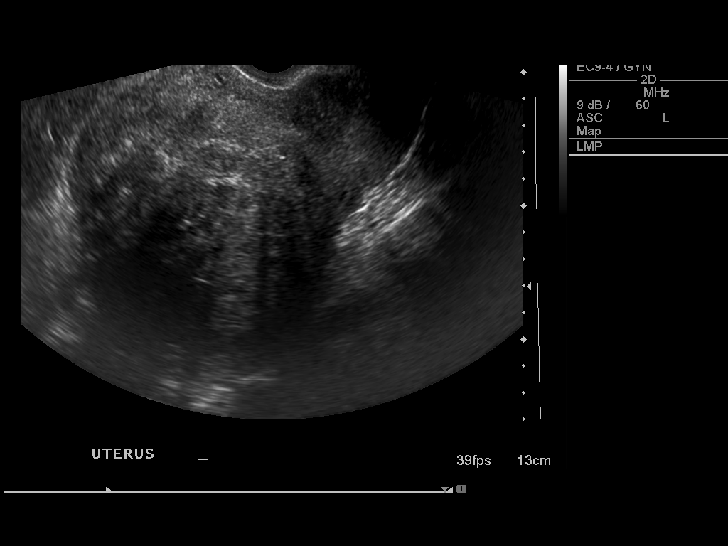
[im 38/51]
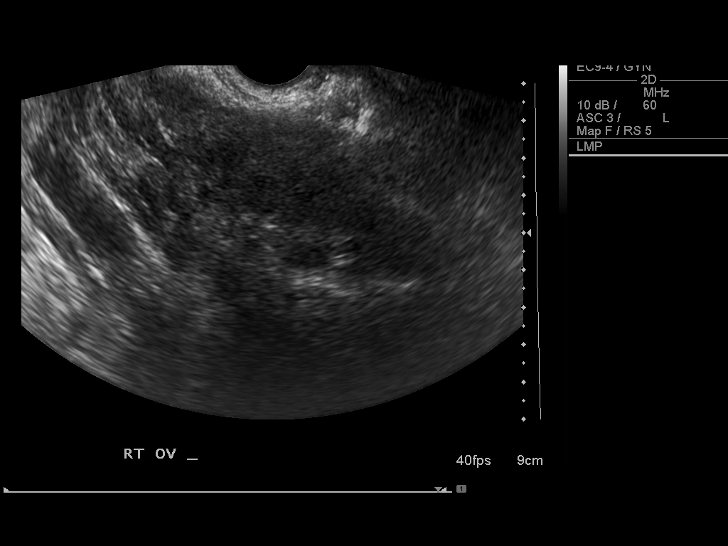
[im 42/51]
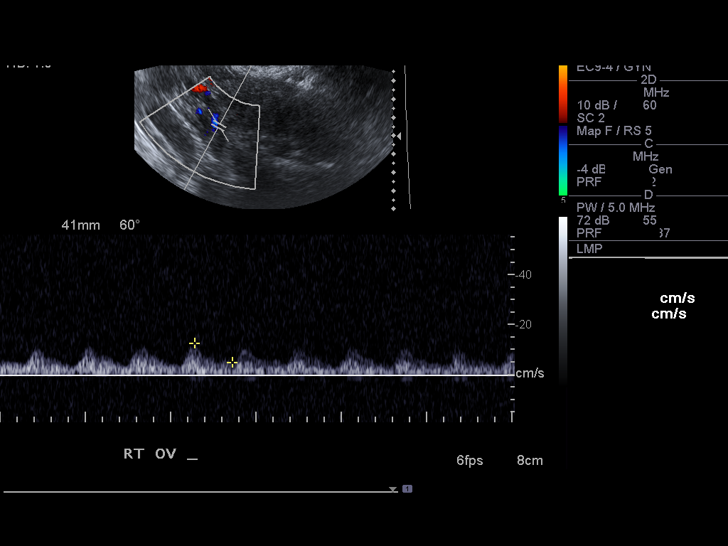
[im 46/51]
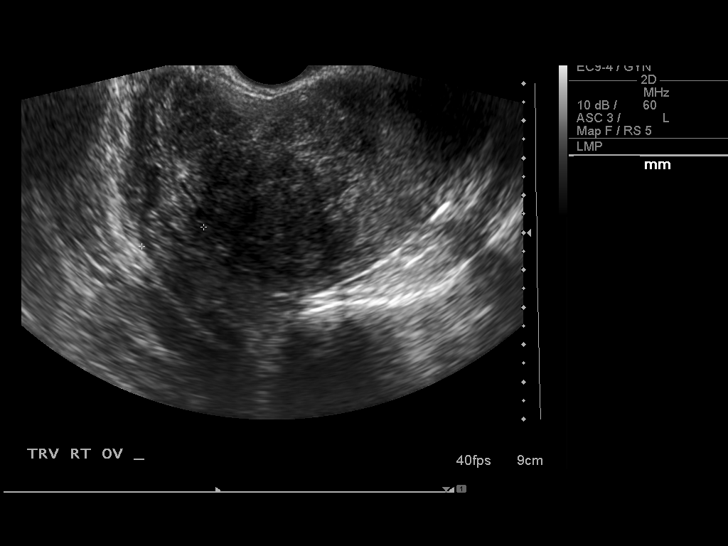
[im 51/51]
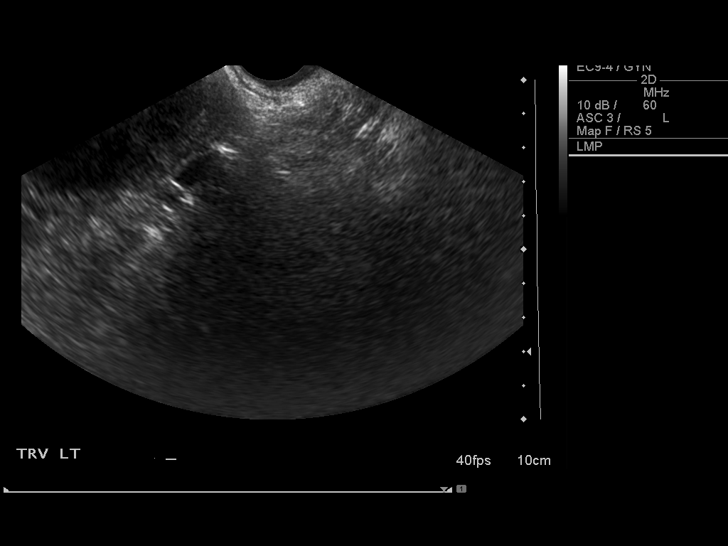

[13 of 25 positions shown; findings below may reference images not displayed]

FINDINGS: Uterus:  The uterus is heterogeneously enlarged with multiple
fibroids.  Overall dimensions of the uterus are approximately
x 11.5 x 11.0 cm.  Largest fibroid is noted in the fundus,
measuring approximately 8.0 x 7.3 x 9.2 cm.

Endometrium:  Distorted by the fibroids and suboptimally
visualized.  No gross endometrial thickening identified.
Endometrial thickness is approximately 8 mm.

Right ovary:  Not well visualized but measuring approximately 3.2 x
1.8 x 1.7 cm.  No adnexal mass.  Blood flow is seen with color
Doppler.

Left ovary:  Not visualized.

Pulsed Doppler evaluation demonstrates normal low-resistance
arterial and venous waveforms in the right ovary.  The left ovary
is not visualized.
IMPRESSION: 1.  Multiple uterine fibroids as partially imaged on the prior
lumbar MRI.
2.  The right ovary appears normal and demonstrates normal blood
flow.  The left ovary is not visualized.  No adnexal mass
identified.

## 2013-10-14 IMAGING — CT CT ABD-PELV W/ CM
2 of 5 series · 17 of 46 positions shown, 19 images · IV contrast (APPLIED)
Comparison: 02/28/2012 ultrasound

CLINICAL DATA: Abdominal pain

CT ABDOMEN AND PELVIS WITH CONTRAST
TECHNIQUE: Multidetector CT imaging of the abdomen and pelvis was
performed following the standard protocol during bolus
administration of intravenous contrast.
Contrast: 100mL OMNIPAQUE IOHEXOL 300 MG/ML  SOLN

[Series 2: abd/pelv with 5.0 b31f st · axial · 0.70mm/px · z∈[-444,-30]mm · 14 of 93 slices shown, 16 images]
[im 5/93  soft-tissue]
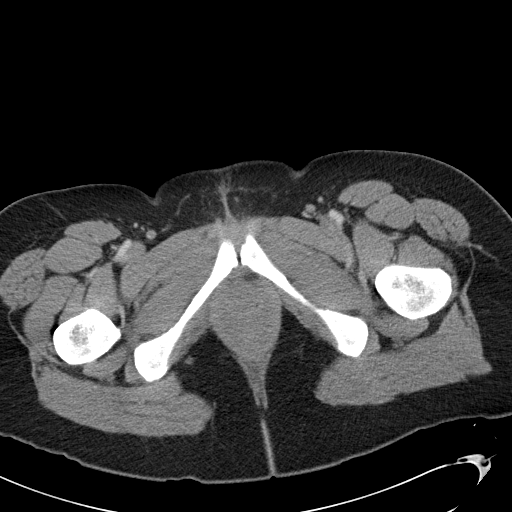
[im 5/93  bone]
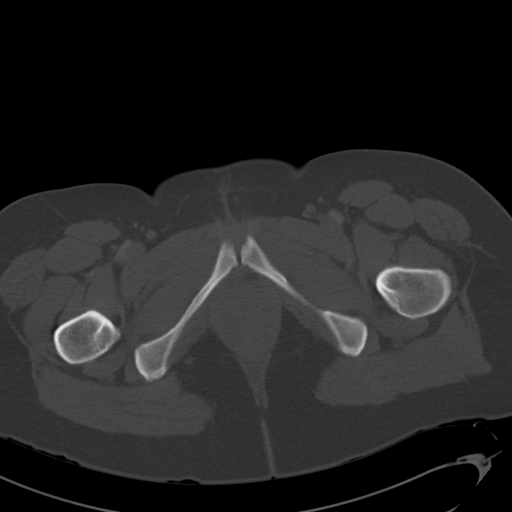
[im 14/93  soft-tissue]
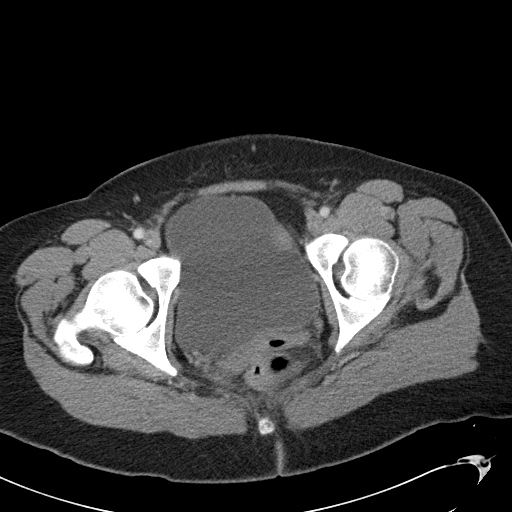
[im 18/93  soft-tissue]
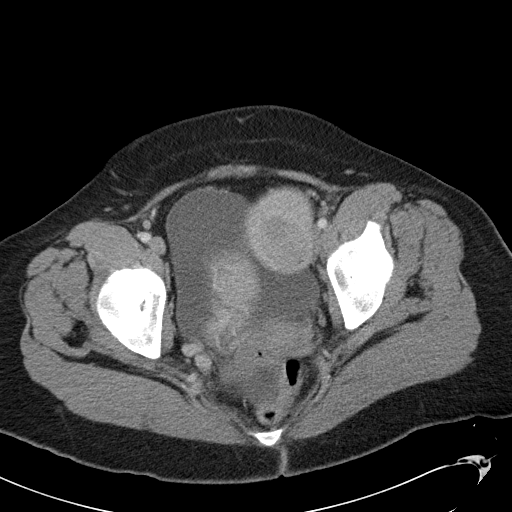
[im 27/93  soft-tissue]
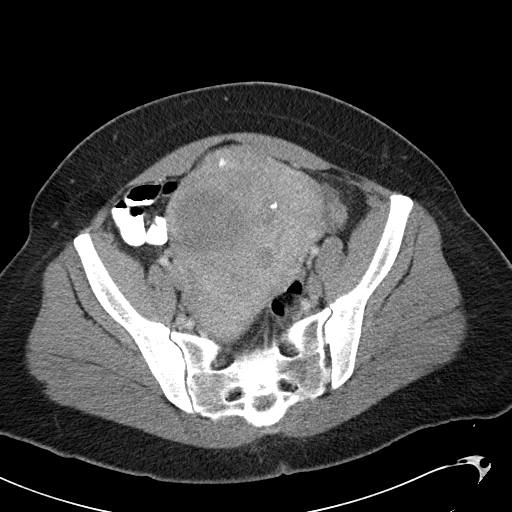
[im 31/93  soft-tissue]
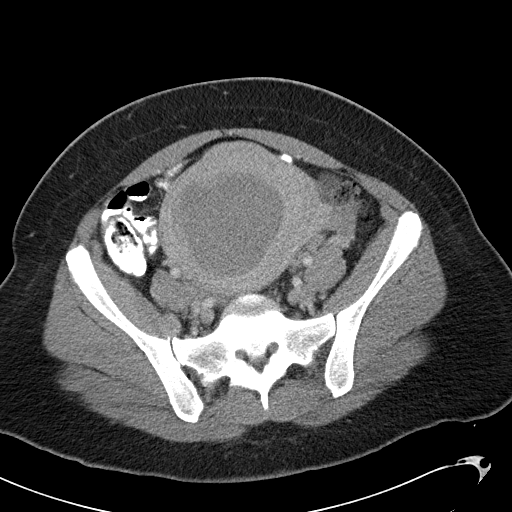
[im 36/93  soft-tissue]
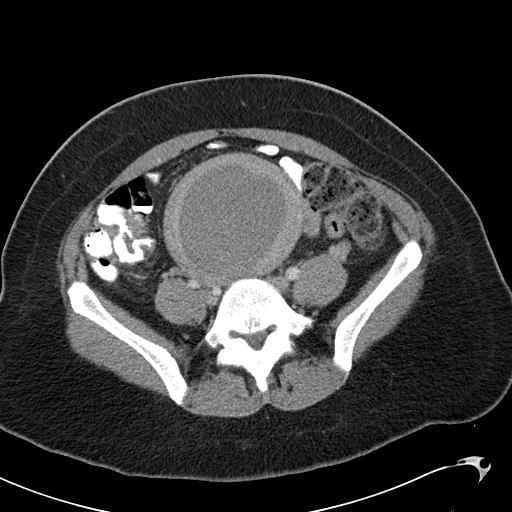
[im 44/93  soft-tissue]
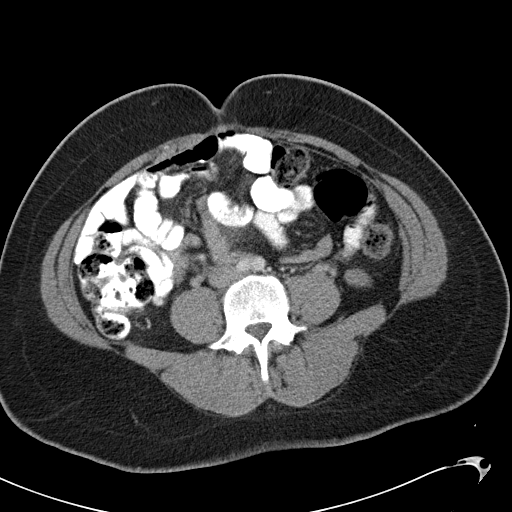
[im 49/93  soft-tissue]
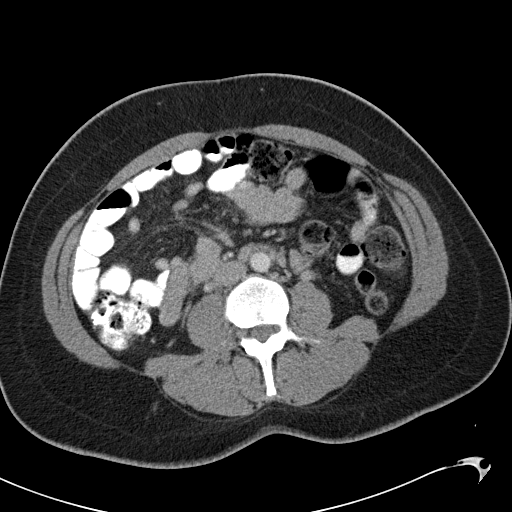
[im 57/93  soft-tissue]
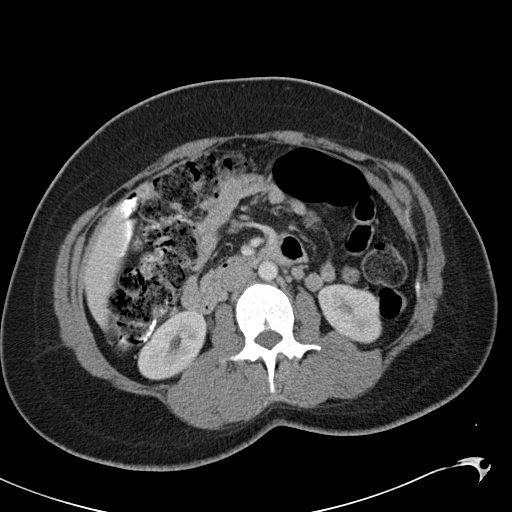
[im 57/93  bone]
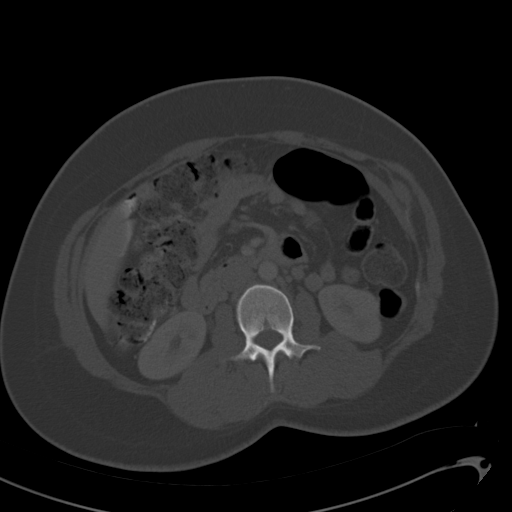
[im 62/93  soft-tissue]
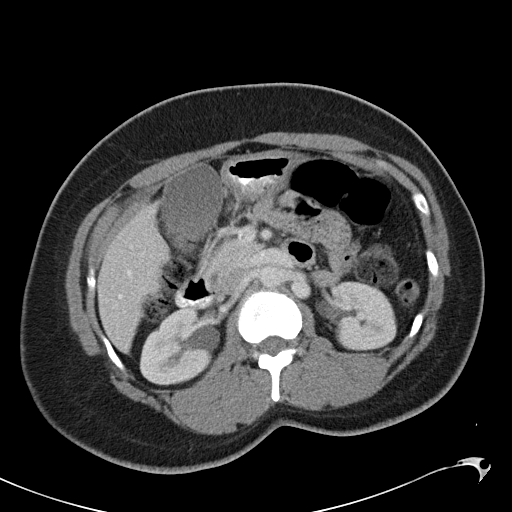
[im 71/93  soft-tissue]
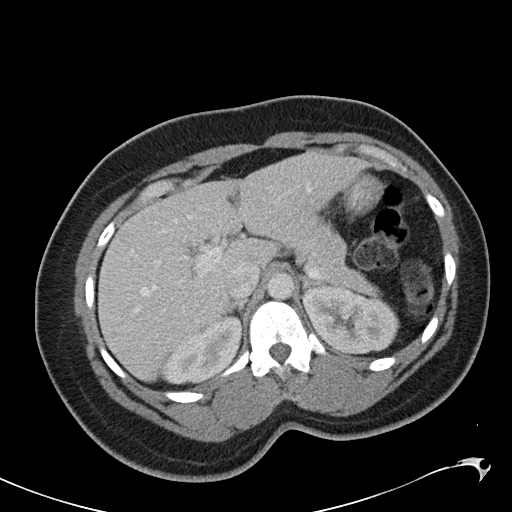
[im 75/93  soft-tissue]
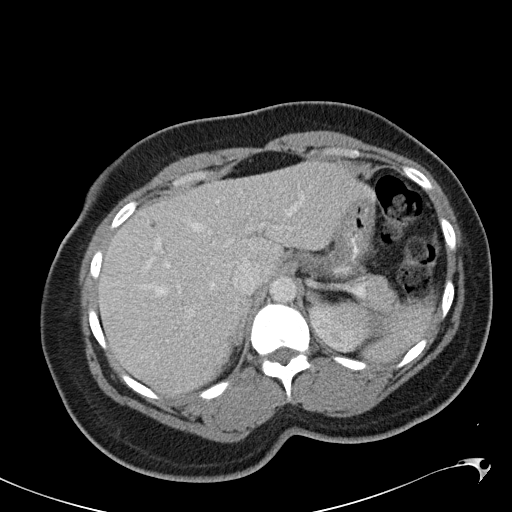
[im 79/93  soft-tissue]
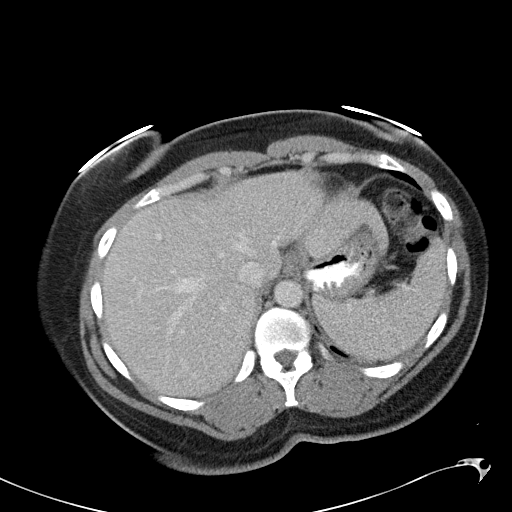
[im 88/93  soft-tissue]
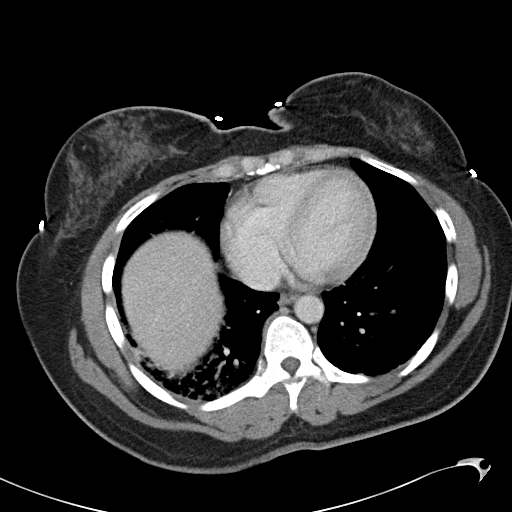

[Series 5: coronals · coronal · 0.90mm/px · 3 of 118 slices shown]
[im 40/118  soft-tissue]
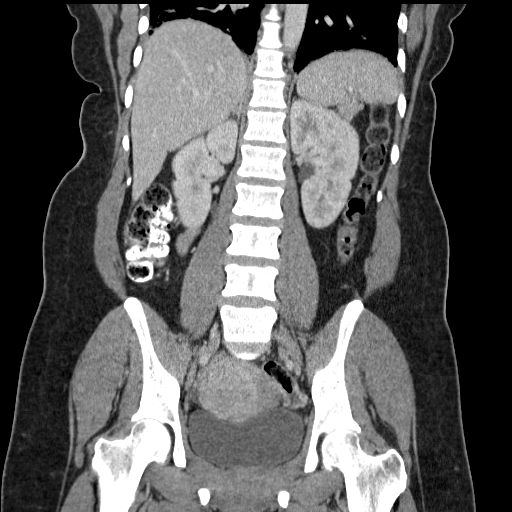
[im 53/118  soft-tissue]
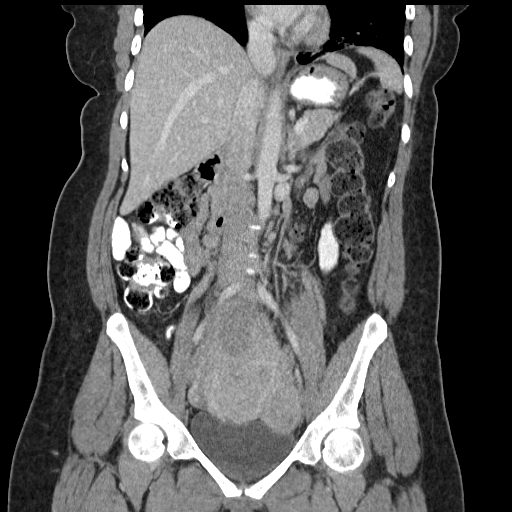
[im 66/118  soft-tissue]
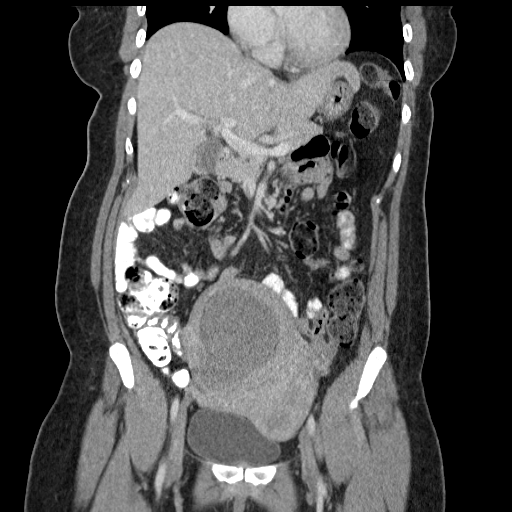

[17 of 46 positions shown; findings below may reference images not displayed]

FINDINGS: Mild bibasilar opacities.  Heart size upper normal
limits.

Subcentimeter hypodensity within the right hepatic lobe is most in
keeping with a biliary cyst or hamartoma.  Distended gallbladder,
contains gallstones.  No pericholecystic fluid or gallbladder wall
thickening.  No biliary ductal dilatation.  Unremarkable spleen,
pancreas, and adrenal glands.  Subcentimeter hypodensity within the
lower pole right kidney is nonspecific.  Right pelvicaliectasis.
Unable to follow the ureteral course in its entirety as the ureter
is decompressed and displaced by the enlarged/fibroid uterus. No
hydroureter where visualized.

No bowel obstruction.  No CT evidence for colitis.  Normal
appendix.  No free intraperitoneal air or fluid.  No
lymphadenopathy.

Normal caliber vasculature.

Thin-walled bladder.  Fibroid uterus.  Displaced adnexa without
definite mass.  Trace free fluid within the pelvis.

No acute osseous finding.
IMPRESSION: Enlarged/fibroid uterus.

Right-sided pelvicaliectasis without hydroureter.

Cholelithiasis without CT evidence for cholecystitis.

## 2013-10-22 ENCOUNTER — Other Ambulatory Visit: Payer: Self-pay

## 2013-10-22 DIAGNOSIS — Z1231 Encounter for screening mammogram for malignant neoplasm of breast: Secondary | ICD-10-CM

## 2013-10-25 ENCOUNTER — Ambulatory Visit
Admission: RE | Admit: 2013-10-25 | Discharge: 2013-10-25 | Disposition: A | Discharge status: Home / Self Care | Payer: BC Managed Care – PPO | Source: Ambulatory Visit

## 2013-10-25 ENCOUNTER — Ambulatory Visit: Payer: BC Managed Care – PPO

## 2013-10-25 DIAGNOSIS — Z1231 Encounter for screening mammogram for malignant neoplasm of breast: Secondary | ICD-10-CM

## 2014-02-21 ENCOUNTER — Other Ambulatory Visit: Payer: Self-pay | Admitting: Orthopaedic Surgery

## 2014-02-21 DIAGNOSIS — M545 Low back pain, unspecified: Secondary | ICD-10-CM

## 2014-02-23 ENCOUNTER — Ambulatory Visit
Admission: RE | Admit: 2014-02-23 | Discharge: 2014-02-23 | Disposition: A | Discharge status: Home / Self Care | Payer: BC Managed Care – PPO | Source: Ambulatory Visit | Attending: Orthopaedic Surgery | Admitting: Orthopaedic Surgery

## 2014-02-23 DIAGNOSIS — M545 Low back pain, unspecified: Secondary | ICD-10-CM
